# Patient Record
Sex: Female | Born: 1935 | Race: White | Hispanic: No | Smoking: Never smoker
Health system: Southern US, Community
[De-identification: ages and names within clinical notes are randomized; demographics above are authoritative.]

## PROBLEM LIST (undated history)

## (undated) DIAGNOSIS — N183 Chronic kidney disease, stage 3 unspecified: Secondary | ICD-10-CM

## (undated) DIAGNOSIS — K5792 Diverticulitis of intestine, part unspecified, without perforation or abscess without bleeding: Secondary | ICD-10-CM

## (undated) DIAGNOSIS — L0390 Cellulitis, unspecified: Secondary | ICD-10-CM

## (undated) DIAGNOSIS — C801 Malignant (primary) neoplasm, unspecified: Secondary | ICD-10-CM

## (undated) DIAGNOSIS — N200 Calculus of kidney: Secondary | ICD-10-CM

## (undated) DIAGNOSIS — S92919A Unspecified fracture of unspecified toe(s), initial encounter for closed fracture: Secondary | ICD-10-CM

## (undated) DIAGNOSIS — E042 Nontoxic multinodular goiter: Secondary | ICD-10-CM

## (undated) DIAGNOSIS — I10 Essential (primary) hypertension: Secondary | ICD-10-CM

## (undated) DIAGNOSIS — E785 Hyperlipidemia, unspecified: Secondary | ICD-10-CM

## (undated) DIAGNOSIS — M4802 Spinal stenosis, cervical region: Secondary | ICD-10-CM

## (undated) DIAGNOSIS — M1990 Unspecified osteoarthritis, unspecified site: Secondary | ICD-10-CM

## (undated) DIAGNOSIS — M810 Age-related osteoporosis without current pathological fracture: Secondary | ICD-10-CM

## (undated) DIAGNOSIS — G250 Essential tremor: Secondary | ICD-10-CM

## (undated) HISTORY — PX: HIP SURGERY: SHX245

## (undated) HISTORY — PX: CHOLECYSTECTOMY: SHX55

## (undated) HISTORY — PX: TONSILLECTOMY: SUR1361

## (undated) HISTORY — PX: APPENDECTOMY: SHX54

## (undated) HISTORY — PX: BREAST LUMPECTOMY: SHX2

## (undated) HISTORY — PX: CATARACT EXTRACTION, BILATERAL: SHX1313

---

## 1980-03-29 HISTORY — PX: COLECTOMY: SHX59

## 2004-01-07 ENCOUNTER — Ambulatory Visit: Payer: Self-pay | Admitting: Internal Medicine

## 2004-02-13 ENCOUNTER — Ambulatory Visit: Payer: Self-pay | Admitting: Oncology

## 2004-03-26 ENCOUNTER — Ambulatory Visit: Payer: Self-pay | Admitting: Oncology

## 2004-04-06 ENCOUNTER — Ambulatory Visit: Payer: Self-pay | Admitting: Urology

## 2004-08-05 ENCOUNTER — Ambulatory Visit: Payer: Self-pay | Admitting: Radiation Oncology

## 2004-09-03 ENCOUNTER — Ambulatory Visit: Payer: Self-pay | Admitting: Oncology

## 2004-12-30 ENCOUNTER — Ambulatory Visit: Payer: Self-pay | Admitting: Unknown Physician Specialty

## 2005-02-19 ENCOUNTER — Ambulatory Visit: Payer: Self-pay | Admitting: Internal Medicine

## 2005-03-03 ENCOUNTER — Ambulatory Visit: Payer: Self-pay | Admitting: Oncology

## 2005-03-29 ENCOUNTER — Ambulatory Visit: Payer: Self-pay | Admitting: Oncology

## 2005-04-29 ENCOUNTER — Ambulatory Visit: Payer: Self-pay | Admitting: Oncology

## 2005-08-05 ENCOUNTER — Ambulatory Visit: Payer: Self-pay | Admitting: Oncology

## 2005-08-30 ENCOUNTER — Ambulatory Visit: Payer: Self-pay | Admitting: Oncology

## 2005-09-26 ENCOUNTER — Ambulatory Visit: Payer: Self-pay | Admitting: Oncology

## 2006-02-07 ENCOUNTER — Ambulatory Visit: Payer: Self-pay | Admitting: Oncology

## 2006-02-28 ENCOUNTER — Ambulatory Visit: Payer: Self-pay | Admitting: Oncology

## 2006-03-29 ENCOUNTER — Ambulatory Visit: Payer: Self-pay | Admitting: Oncology

## 2006-04-29 ENCOUNTER — Ambulatory Visit: Payer: Self-pay | Admitting: Oncology

## 2006-08-22 ENCOUNTER — Emergency Department: Payer: Self-pay | Admitting: Emergency Medicine

## 2006-08-22 ENCOUNTER — Other Ambulatory Visit: Payer: Self-pay

## 2006-08-25 ENCOUNTER — Ambulatory Visit: Payer: Self-pay | Admitting: Emergency Medicine

## 2006-08-28 ENCOUNTER — Ambulatory Visit: Payer: Self-pay | Admitting: Oncology

## 2006-09-01 ENCOUNTER — Ambulatory Visit: Payer: Self-pay | Admitting: Oncology

## 2006-09-23 ENCOUNTER — Ambulatory Visit: Payer: Self-pay | Admitting: Unknown Physician Specialty

## 2006-09-27 ENCOUNTER — Ambulatory Visit: Payer: Self-pay | Admitting: Oncology

## 2007-02-27 ENCOUNTER — Ambulatory Visit: Payer: Self-pay | Admitting: Oncology

## 2007-03-07 ENCOUNTER — Ambulatory Visit: Payer: Self-pay | Admitting: Oncology

## 2007-03-10 ENCOUNTER — Ambulatory Visit: Payer: Self-pay | Admitting: Urology

## 2007-03-30 ENCOUNTER — Ambulatory Visit: Payer: Self-pay | Admitting: Oncology

## 2007-04-30 ENCOUNTER — Ambulatory Visit: Payer: Self-pay | Admitting: Oncology

## 2007-08-28 ENCOUNTER — Ambulatory Visit: Payer: Self-pay | Admitting: Oncology

## 2007-09-14 ENCOUNTER — Ambulatory Visit: Payer: Self-pay | Admitting: Oncology

## 2007-09-15 IMAGING — US US PELV - US TRANSVAGINAL
1 series · 17 of 25 positions shown · non-contrast
Comparison: none

REASON FOR EXAM: pelvic pain
COMMENTS:

[Series 1: us pelv - us transvaginal · 17 of 34 slices shown]
[im 1/34]
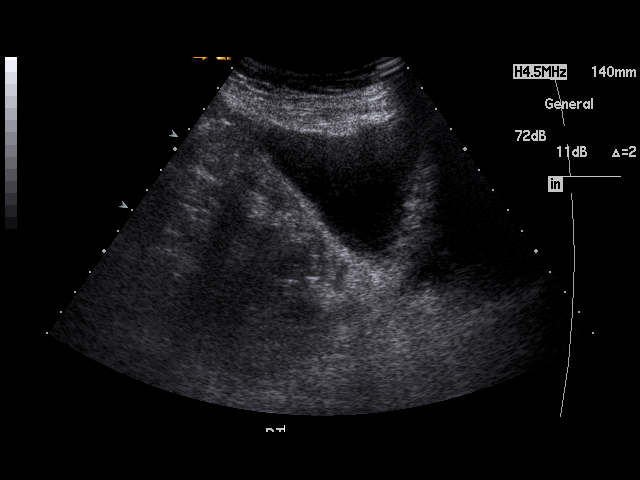
[im 3/34]
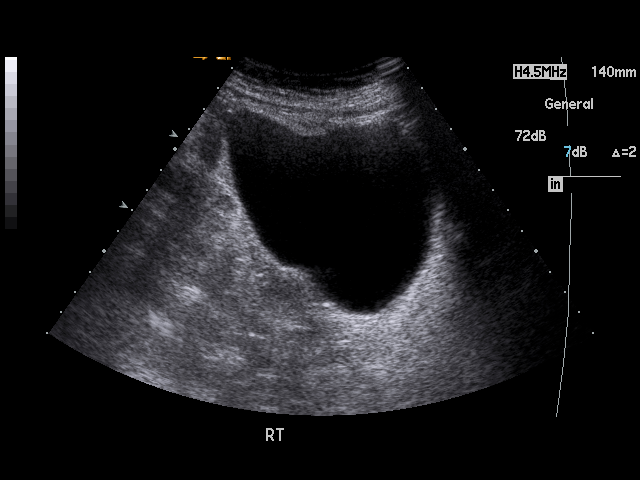
[im 5/34]
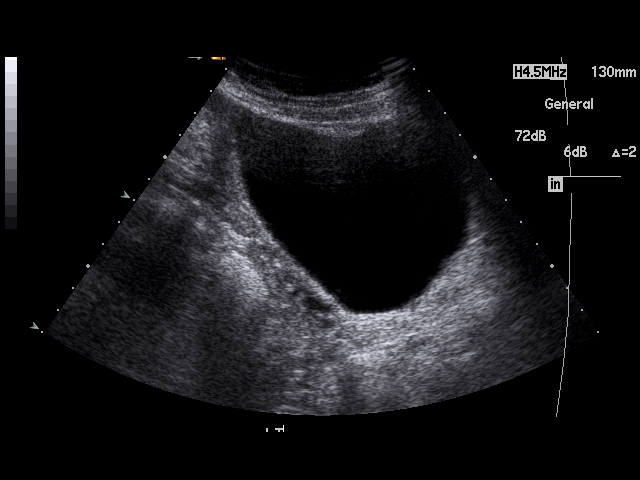
[im 7/34]
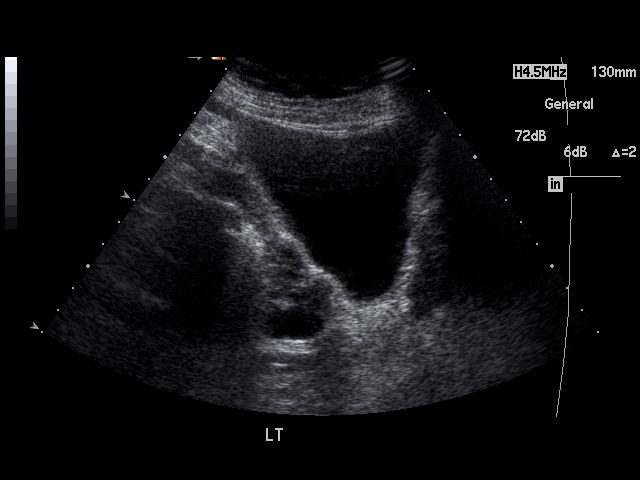
[im 9/34]
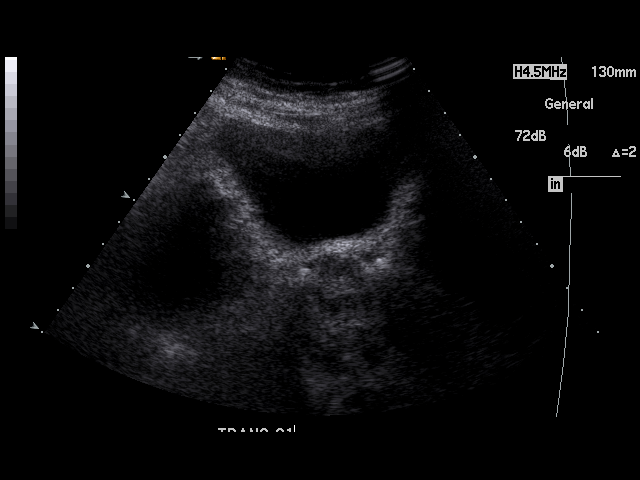
[im 12/34]
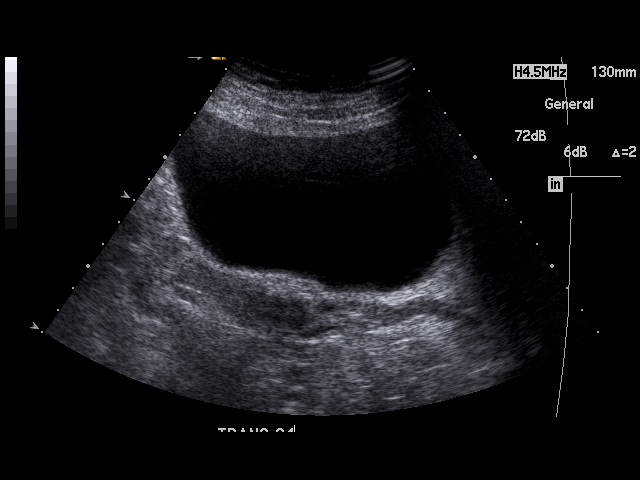
[im 13/34]
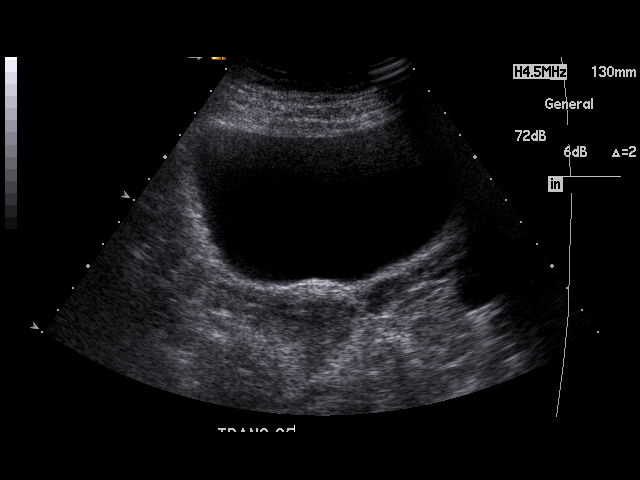
[im 16/34]
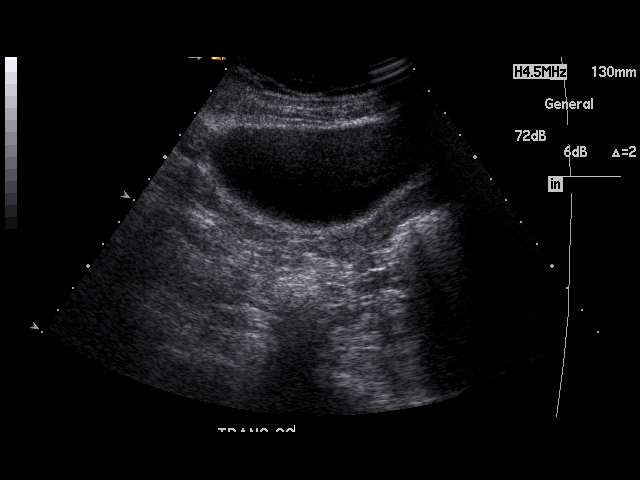
[im 17/34]
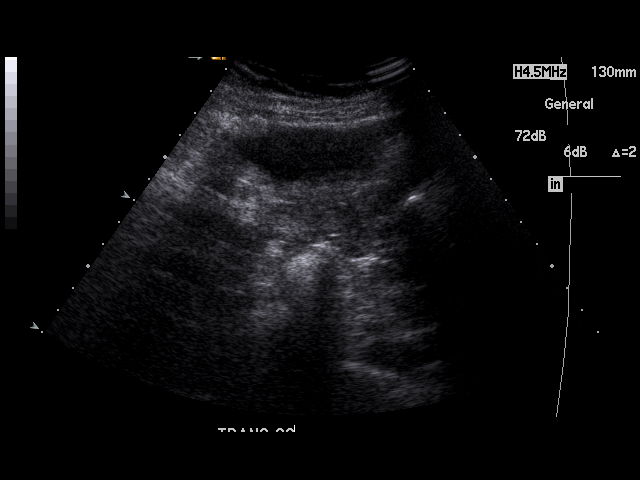
[im 18/34]
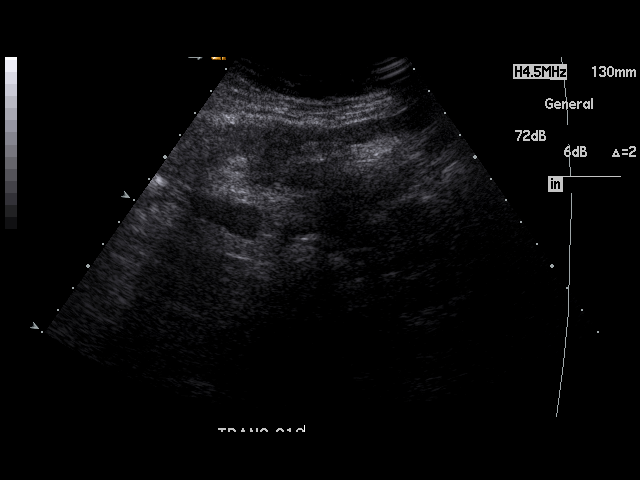
[im 21/34]
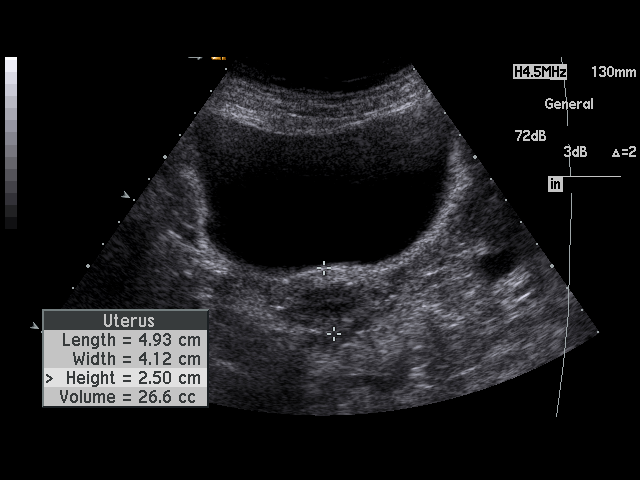
[im 23/34]
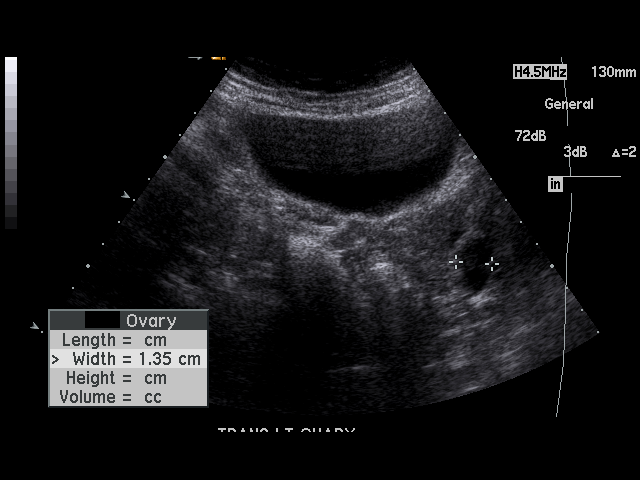
[im 25/34]
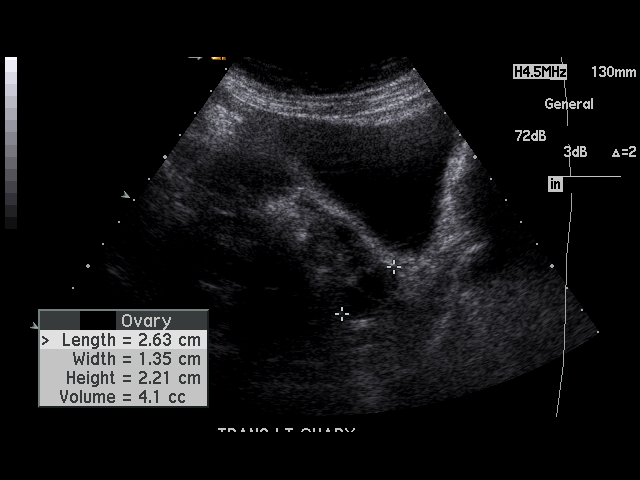
[im 27/34]
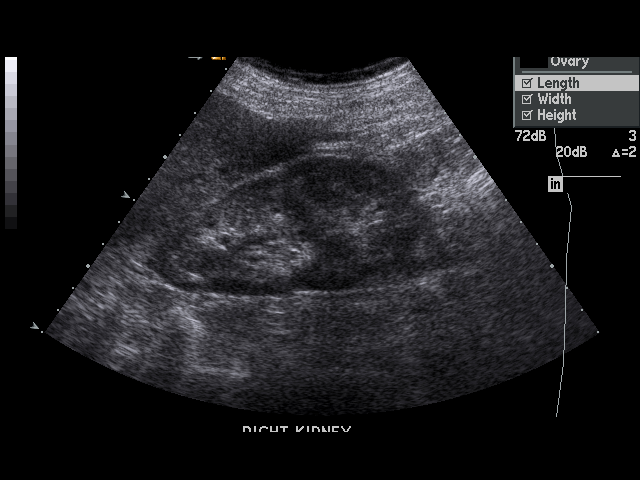
[im 29/34]
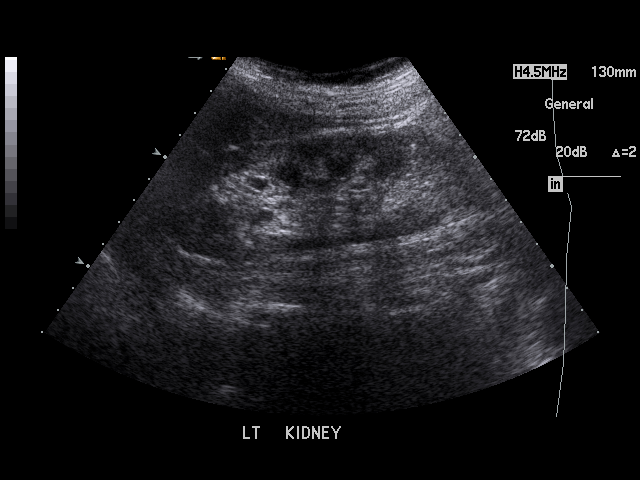
[im 31/34]
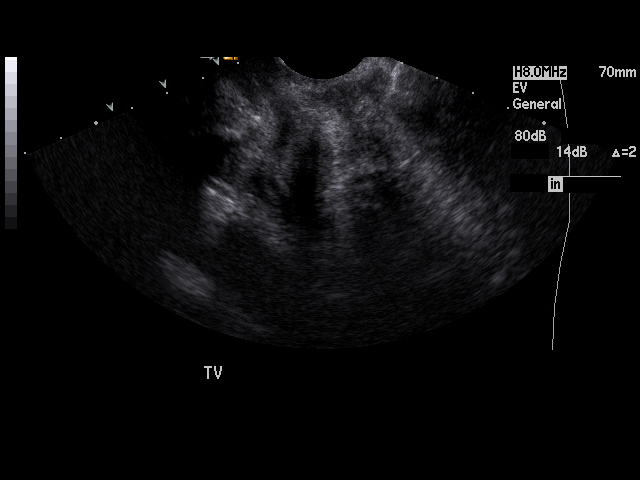
[im 34/34]
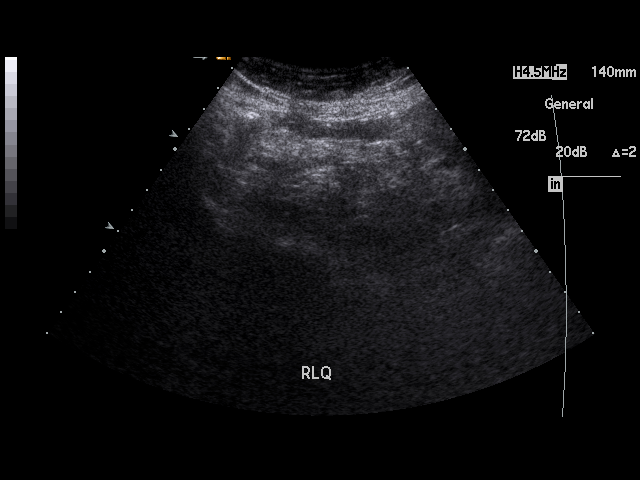

[17 of 25 positions shown; findings below may reference images not displayed]

PROCEDURE:     US  - US PELVIS MASS EXAM  - [DATE] [DATE] [DATE]  [DATE]

RESULT:     The uterus measures 4.93 cm x 4.12 cm x 2.50 cm.  No uterine
mass lesions are seen.  The endometrium measures 7.4 mm in thickness which
is greater than expected for the patient's age.  The RIGHT ovary is not
seen.  There is a 2.2 cm cystic structure in the LEFT adnexal area
compatible with a LEFT ovarian cyst.  No free fluid is noted in the pelvis.
The visualized portion of the urinary bladder is normal in appearance.  The
kidneys show no hydronephrosis.
IMPRESSION: The uterus is normal in size.

No focal uterine mass lesions are seen but the endometrium measures 7.4 mm
in thickness which is abnormally thick for the patient's age.

LEFT ovarian cyst.

The RIGHT ovary is not seen.

## 2007-09-27 ENCOUNTER — Ambulatory Visit: Payer: Self-pay | Admitting: Oncology

## 2007-10-18 ENCOUNTER — Ambulatory Visit: Payer: Self-pay | Admitting: Family Medicine

## 2007-10-20 ENCOUNTER — Other Ambulatory Visit: Payer: Self-pay

## 2007-10-20 ENCOUNTER — Ambulatory Visit: Payer: Self-pay | Admitting: Cardiology

## 2007-10-20 ENCOUNTER — Ambulatory Visit: Payer: Self-pay | Admitting: Surgery

## 2007-10-25 ENCOUNTER — Ambulatory Visit: Payer: Self-pay | Admitting: Surgery

## 2008-02-27 ENCOUNTER — Ambulatory Visit: Payer: Self-pay | Admitting: Oncology

## 2008-03-14 ENCOUNTER — Ambulatory Visit: Payer: Self-pay | Admitting: Oncology

## 2008-03-29 ENCOUNTER — Ambulatory Visit: Payer: Self-pay | Admitting: Oncology

## 2008-04-17 ENCOUNTER — Ambulatory Visit: Payer: Self-pay | Admitting: Oncology

## 2008-08-27 ENCOUNTER — Ambulatory Visit: Payer: Self-pay | Admitting: Oncology

## 2008-09-09 ENCOUNTER — Ambulatory Visit: Payer: Self-pay | Admitting: Urology

## 2008-09-10 ENCOUNTER — Ambulatory Visit: Payer: Self-pay | Admitting: Urology

## 2008-09-11 ENCOUNTER — Ambulatory Visit: Payer: Self-pay | Admitting: Oncology

## 2008-09-26 ENCOUNTER — Ambulatory Visit: Payer: Self-pay | Admitting: Oncology

## 2008-11-20 ENCOUNTER — Ambulatory Visit: Payer: Self-pay | Admitting: Unknown Physician Specialty

## 2008-11-25 ENCOUNTER — Ambulatory Visit: Payer: Self-pay | Admitting: Unknown Physician Specialty

## 2009-05-30 ENCOUNTER — Ambulatory Visit: Payer: Self-pay | Admitting: Internal Medicine

## 2009-10-27 ENCOUNTER — Ambulatory Visit: Payer: Self-pay | Admitting: Oncology

## 2009-10-31 ENCOUNTER — Ambulatory Visit: Payer: Self-pay | Admitting: Oncology

## 2009-11-27 ENCOUNTER — Ambulatory Visit: Payer: Self-pay | Admitting: Oncology

## 2010-06-01 ENCOUNTER — Ambulatory Visit: Payer: Self-pay | Admitting: Oncology

## 2010-11-03 ENCOUNTER — Ambulatory Visit: Payer: Self-pay | Admitting: Oncology

## 2010-11-28 ENCOUNTER — Ambulatory Visit: Payer: Self-pay | Admitting: Oncology

## 2011-06-02 ENCOUNTER — Ambulatory Visit: Payer: Self-pay | Admitting: Oncology

## 2011-10-19 ENCOUNTER — Ambulatory Visit: Payer: Self-pay | Admitting: Ophthalmology

## 2011-11-01 ENCOUNTER — Inpatient Hospital Stay: Payer: Self-pay | Admitting: Internal Medicine

## 2011-11-01 LAB — COMPREHENSIVE METABOLIC PANEL
Albumin: 3.5 g/dL (ref 3.4–5.0)
Alkaline Phosphatase: 70 U/L (ref 50–136)
Bilirubin,Total: 0.6 mg/dL (ref 0.2–1.0)
Calcium, Total: 9.3 mg/dL (ref 8.5–10.1)
Co2: 28 mmol/L (ref 21–32)
Creatinine: 1.71 mg/dL — ABNORMAL HIGH (ref 0.60–1.30)
EGFR (African American): 30 — ABNORMAL LOW
EGFR (Non-African Amer.): 26 — ABNORMAL LOW
Glucose: 88 mg/dL (ref 65–99)
Osmolality: 297 (ref 275–301)
SGOT(AST): 21 U/L (ref 15–37)
SGPT (ALT): 18 U/L (ref 12–78)

## 2011-11-01 LAB — CBC
HCT: 33.2 % — ABNORMAL LOW (ref 35.0–47.0)
HGB: 11.2 g/dL — ABNORMAL LOW (ref 12.0–16.0)
MCH: 32.4 pg (ref 26.0–34.0)
MCHC: 33.6 g/dL (ref 32.0–36.0)
RBC: 3.45 10*6/uL — ABNORMAL LOW (ref 3.80–5.20)
WBC: 12.4 10*3/uL — ABNORMAL HIGH (ref 3.6–11.0)

## 2011-11-01 LAB — PROTIME-INR
INR: 0.9
Prothrombin Time: 12.5 secs (ref 11.5–14.7)

## 2011-11-01 LAB — HEMOGLOBIN: HGB: 10.7 g/dL — ABNORMAL LOW (ref 12.0–16.0)

## 2011-11-02 LAB — CBC WITH DIFFERENTIAL/PLATELET
Basophil %: 0.3 %
Eosinophil #: 0.5 10*3/uL (ref 0.0–0.7)
Eosinophil %: 6.1 %
HCT: 24.8 % — ABNORMAL LOW (ref 35.0–47.0)
HGB: 8.6 g/dL — ABNORMAL LOW (ref 12.0–16.0)
Lymphocyte %: 18.6 %
MCHC: 34.6 g/dL (ref 32.0–36.0)
Monocyte #: 0.8 x10 3/mm (ref 0.2–0.9)
Monocyte %: 9 %
Neutrophil %: 66 %
Platelet: 147 10*3/uL — ABNORMAL LOW (ref 150–440)
RBC: 2.59 10*6/uL — ABNORMAL LOW (ref 3.80–5.20)
WBC: 8.4 10*3/uL (ref 3.6–11.0)

## 2011-11-02 LAB — URINALYSIS, COMPLETE
Bacteria: NONE SEEN
Bilirubin,UR: NEGATIVE
Glucose,UR: NEGATIVE mg/dL (ref 0–75)
Ketone: NEGATIVE
Leukocyte Esterase: NEGATIVE
Ph: 5 (ref 4.5–8.0)
Protein: NEGATIVE
RBC,UR: <1 /HPF (ref 0–5)
Squamous Epithelial: <1
WBC UR: 1 /HPF (ref 0–5)

## 2011-11-02 LAB — BASIC METABOLIC PANEL
Anion Gap: 8 (ref 7–16)
BUN: 48 mg/dL — ABNORMAL HIGH (ref 7–18)
Co2: 26 mmol/L (ref 21–32)
Creatinine: 1.64 mg/dL — ABNORMAL HIGH (ref 0.60–1.30)
EGFR (African American): 32 — ABNORMAL LOW
EGFR (Non-African Amer.): 28 — ABNORMAL LOW
Glucose: 88 mg/dL (ref 65–99)
Sodium: 144 mmol/L (ref 136–145)

## 2011-11-03 LAB — COMPREHENSIVE METABOLIC PANEL
Alkaline Phosphatase: 56 U/L (ref 50–136)
Anion Gap: 9 (ref 7–16)
BUN: 35 mg/dL — ABNORMAL HIGH (ref 7–18)
Bilirubin,Total: 0.5 mg/dL (ref 0.2–1.0)
Chloride: 113 mmol/L — ABNORMAL HIGH (ref 98–107)
Creatinine: 1.41 mg/dL — ABNORMAL HIGH (ref 0.60–1.30)
EGFR (African American): 38 — ABNORMAL LOW
Glucose: 110 mg/dL — ABNORMAL HIGH (ref 65–99)
Potassium: 3.9 mmol/L (ref 3.5–5.1)
SGOT(AST): 10 U/L — ABNORMAL LOW (ref 15–37)
SGPT (ALT): 11 U/L — ABNORMAL LOW (ref 12–78)
Total Protein: 4.9 g/dL — ABNORMAL LOW (ref 6.4–8.2)

## 2011-11-03 LAB — CBC WITH DIFFERENTIAL/PLATELET
Basophil %: 0.2 %
Eosinophil #: 0 10*3/uL (ref 0.0–0.7)
Eosinophil %: 0 %
HCT: 27 % — ABNORMAL LOW (ref 35.0–47.0)
HGB: 9.3 g/dL — ABNORMAL LOW (ref 12.0–16.0)
Lymphocyte %: 11.5 %
MCHC: 34.5 g/dL (ref 32.0–36.0)
MCV: 94 fL (ref 80–100)
Monocyte %: 5.8 %
Neutrophil #: 10 10*3/uL — ABNORMAL HIGH (ref 1.4–6.5)
Neutrophil %: 82.5 %
RBC: 2.87 10*6/uL — ABNORMAL LOW (ref 3.80–5.20)
WBC: 12.1 10*3/uL — ABNORMAL HIGH (ref 3.6–11.0)

## 2011-11-04 LAB — CBC WITH DIFFERENTIAL/PLATELET
Basophil #: 0 10*3/uL (ref 0.0–0.1)
Basophil %: 0.5 %
Eosinophil #: 0.2 10*3/uL (ref 0.0–0.7)
HGB: 9.2 g/dL — ABNORMAL LOW (ref 12.0–16.0)
Lymphocyte #: 1.8 10*3/uL (ref 1.0–3.6)
Lymphocyte %: 21 %
MCH: 32.7 pg (ref 26.0–34.0)
MCHC: 34.6 g/dL (ref 32.0–36.0)
MCV: 95 fL (ref 80–100)
Monocyte #: 0.7 x10 3/mm (ref 0.2–0.9)
Neutrophil #: 5.8 10*3/uL (ref 1.4–6.5)
Neutrophil %: 67.4 %
RDW: 15 % — ABNORMAL HIGH (ref 11.5–14.5)

## 2011-11-04 LAB — BASIC METABOLIC PANEL
BUN: 27 mg/dL — ABNORMAL HIGH (ref 7–18)
Calcium, Total: 8.1 mg/dL — ABNORMAL LOW (ref 8.5–10.1)
Chloride: 115 mmol/L — ABNORMAL HIGH (ref 98–107)
Co2: 27 mmol/L (ref 21–32)
Creatinine: 1.62 mg/dL — ABNORMAL HIGH (ref 0.60–1.30)
EGFR (African American): 33 — ABNORMAL LOW
EGFR (Non-African Amer.): 28 — ABNORMAL LOW
Glucose: 98 mg/dL (ref 65–99)
Potassium: 4 mmol/L (ref 3.5–5.1)
Sodium: 149 mmol/L — ABNORMAL HIGH (ref 136–145)

## 2011-11-05 LAB — CBC WITH DIFFERENTIAL/PLATELET
Basophil #: 0 10*3/uL (ref 0.0–0.1)
Basophil %: 0.3 %
Eosinophil #: 0.3 10*3/uL (ref 0.0–0.7)
Eosinophil %: 3.8 %
HGB: 9.2 g/dL — ABNORMAL LOW (ref 12.0–16.0)
Lymphocyte %: 20.4 %
MCH: 32.8 pg (ref 26.0–34.0)
MCHC: 34.4 g/dL (ref 32.0–36.0)
Monocyte #: 0.8 x10 3/mm (ref 0.2–0.9)
Neutrophil %: 67 %
Platelet: 140 10*3/uL — ABNORMAL LOW (ref 150–440)
RBC: 2.8 10*6/uL — ABNORMAL LOW (ref 3.80–5.20)

## 2011-11-08 ENCOUNTER — Ambulatory Visit: Payer: Self-pay | Admitting: Oncology

## 2011-11-08 LAB — CBC CANCER CENTER
Basophil #: 0 x10 3/mm (ref 0.0–0.1)
Basophil %: 0.4 %
Eosinophil %: 4.3 %
HGB: 10.3 g/dL — ABNORMAL LOW (ref 12.0–16.0)
Lymphocyte #: 1.5 x10 3/mm (ref 1.0–3.6)
Lymphocyte %: 17 %
MCH: 32.4 pg (ref 26.0–34.0)
MCV: 97 fL (ref 80–100)
Monocyte #: 0.8 x10 3/mm (ref 0.2–0.9)
Neutrophil #: 6 x10 3/mm (ref 1.4–6.5)
RBC: 3.17 10*6/uL — ABNORMAL LOW (ref 3.80–5.20)
RDW: 14.9 % — ABNORMAL HIGH (ref 11.5–14.5)
WBC: 8.7 x10 3/mm (ref 3.6–11.0)

## 2011-11-08 LAB — COMPREHENSIVE METABOLIC PANEL
Anion Gap: 6 — ABNORMAL LOW (ref 7–16)
Calcium, Total: 8.9 mg/dL (ref 8.5–10.1)
Chloride: 103 mmol/L (ref 98–107)
Co2: 33 mmol/L — ABNORMAL HIGH (ref 21–32)
Creatinine: 1.52 mg/dL — ABNORMAL HIGH (ref 0.60–1.30)
EGFR (African American): 35 — ABNORMAL LOW
EGFR (Non-African Amer.): 30 — ABNORMAL LOW
Osmolality: 287 (ref 275–301)
Potassium: 4.4 mmol/L (ref 3.5–5.1)
Sodium: 142 mmol/L (ref 136–145)
Total Protein: 6.1 g/dL — ABNORMAL LOW (ref 6.4–8.2)

## 2011-11-28 ENCOUNTER — Ambulatory Visit: Payer: Self-pay | Admitting: Oncology

## 2012-06-15 ENCOUNTER — Encounter: Payer: Self-pay | Admitting: Rheumatology

## 2012-06-27 ENCOUNTER — Encounter: Payer: Self-pay | Admitting: Rheumatology

## 2012-06-27 ENCOUNTER — Ambulatory Visit: Payer: Self-pay | Admitting: Oncology

## 2012-08-08 ENCOUNTER — Ambulatory Visit: Payer: Self-pay | Admitting: Rheumatology

## 2012-11-03 ENCOUNTER — Ambulatory Visit: Payer: Self-pay | Admitting: Oncology

## 2012-11-06 LAB — CBC CANCER CENTER
Basophil #: 0 x10 3/mm (ref 0.0–0.1)
Basophil %: 0.5 %
Eosinophil #: 0.3 x10 3/mm (ref 0.0–0.7)
HGB: 12.6 g/dL (ref 12.0–16.0)
Lymphocyte #: 2 x10 3/mm (ref 1.0–3.6)
Lymphocyte %: 24.8 %
MCH: 33.8 pg (ref 26.0–34.0)
MCV: 97 fL (ref 80–100)
Monocyte #: 0.8 x10 3/mm (ref 0.2–0.9)
Neutrophil #: 4.9 x10 3/mm (ref 1.4–6.5)
Neutrophil %: 61.2 %
Platelet: 203 x10 3/mm (ref 150–440)
RBC: 3.74 10*6/uL — ABNORMAL LOW (ref 3.80–5.20)
RDW: 13.9 % (ref 11.5–14.5)
WBC: 8 x10 3/mm (ref 3.6–11.0)

## 2012-11-06 LAB — COMPREHENSIVE METABOLIC PANEL
Albumin: 3.6 g/dL (ref 3.4–5.0)
Alkaline Phosphatase: 72 U/L (ref 50–136)
Calcium, Total: 9.6 mg/dL (ref 8.5–10.1)
Chloride: 104 mmol/L (ref 98–107)
Co2: 31 mmol/L (ref 21–32)
Creatinine: 1.61 mg/dL — ABNORMAL HIGH (ref 0.60–1.30)
EGFR (African American): 33 — ABNORMAL LOW
EGFR (Non-African Amer.): 28 — ABNORMAL LOW
Glucose: 89 mg/dL (ref 65–99)
Osmolality: 281 (ref 275–301)
Potassium: 4.9 mmol/L (ref 3.5–5.1)
SGOT(AST): 20 U/L (ref 15–37)
SGPT (ALT): 21 U/L (ref 12–78)
Sodium: 140 mmol/L (ref 136–145)
Total Protein: 6.4 g/dL (ref 6.4–8.2)

## 2012-11-27 ENCOUNTER — Ambulatory Visit: Payer: Self-pay | Admitting: Oncology

## 2013-02-16 ENCOUNTER — Ambulatory Visit: Payer: Self-pay | Admitting: Oncology

## 2013-02-17 ENCOUNTER — Emergency Department: Payer: Self-pay | Admitting: Emergency Medicine

## 2013-02-18 ENCOUNTER — Ambulatory Visit: Payer: Self-pay | Admitting: Oncology

## 2013-06-04 ENCOUNTER — Emergency Department: Payer: Self-pay | Admitting: Emergency Medicine

## 2013-06-09 ENCOUNTER — Emergency Department: Payer: Self-pay | Admitting: Internal Medicine

## 2013-06-29 ENCOUNTER — Ambulatory Visit: Payer: Self-pay | Admitting: Internal Medicine

## 2013-08-23 ENCOUNTER — Inpatient Hospital Stay: Payer: Self-pay | Admitting: Internal Medicine

## 2013-08-23 DIAGNOSIS — M4802 Spinal stenosis, cervical region: Secondary | ICD-10-CM | POA: Insufficient documentation

## 2013-08-23 DIAGNOSIS — K5731 Diverticulosis of large intestine without perforation or abscess with bleeding: Secondary | ICD-10-CM | POA: Insufficient documentation

## 2013-08-23 DIAGNOSIS — G250 Essential tremor: Secondary | ICD-10-CM | POA: Insufficient documentation

## 2013-08-23 DIAGNOSIS — N183 Chronic kidney disease, stage 3 unspecified: Secondary | ICD-10-CM | POA: Insufficient documentation

## 2013-08-23 DIAGNOSIS — N200 Calculus of kidney: Secondary | ICD-10-CM | POA: Insufficient documentation

## 2013-08-23 DIAGNOSIS — C189 Malignant neoplasm of colon, unspecified: Secondary | ICD-10-CM | POA: Insufficient documentation

## 2013-08-23 DIAGNOSIS — M810 Age-related osteoporosis without current pathological fracture: Secondary | ICD-10-CM | POA: Insufficient documentation

## 2013-08-23 DIAGNOSIS — E042 Nontoxic multinodular goiter: Secondary | ICD-10-CM | POA: Insufficient documentation

## 2013-08-23 DIAGNOSIS — E785 Hyperlipidemia, unspecified: Secondary | ICD-10-CM | POA: Insufficient documentation

## 2013-08-23 LAB — CBC
HCT: 34.1 % — ABNORMAL LOW (ref 35.0–47.0)
HGB: 11.3 g/dL — ABNORMAL LOW (ref 12.0–16.0)
MCH: 32.4 pg (ref 26.0–34.0)
MCHC: 33 g/dL (ref 32.0–36.0)
MCV: 98 fL (ref 80–100)
PLATELETS: 178 10*3/uL (ref 150–440)
RBC: 3.47 10*6/uL — ABNORMAL LOW (ref 3.80–5.20)
RDW: 12.8 % (ref 11.5–14.5)
WBC: 14.7 10*3/uL — ABNORMAL HIGH (ref 3.6–11.0)

## 2013-08-23 LAB — CK TOTAL AND CKMB (NOT AT ARMC)
CK, Total: 244 U/L — ABNORMAL HIGH
CK-MB: 6.9 ng/mL — AB (ref 0.5–3.6)

## 2013-08-23 LAB — BASIC METABOLIC PANEL
Anion Gap: 6 — ABNORMAL LOW (ref 7–16)
BUN: 22 mg/dL — AB (ref 7–18)
CHLORIDE: 106 mmol/L (ref 98–107)
Calcium, Total: 8.9 mg/dL (ref 8.5–10.1)
Co2: 29 mmol/L (ref 21–32)
Creatinine: 1.38 mg/dL — ABNORMAL HIGH (ref 0.60–1.30)
EGFR (African American): 39 — ABNORMAL LOW
EGFR (Non-African Amer.): 34 — ABNORMAL LOW
Glucose: 122 mg/dL — ABNORMAL HIGH (ref 65–99)
Osmolality: 286 (ref 275–301)
POTASSIUM: 4.5 mmol/L (ref 3.5–5.1)
SODIUM: 141 mmol/L (ref 136–145)

## 2013-08-23 LAB — PROTIME-INR
INR: 1.1
Prothrombin Time: 13.6 secs (ref 11.5–14.7)

## 2013-08-23 LAB — TROPONIN I: Troponin-I: 0.63 ng/mL — ABNORMAL HIGH

## 2013-08-23 LAB — APTT: Activated PTT: 28.4 secs (ref 23.6–35.9)

## 2013-08-24 LAB — TROPONIN I: Troponin-I: 1.9 ng/mL — ABNORMAL HIGH

## 2013-08-24 LAB — CBC WITH DIFFERENTIAL/PLATELET
BASOS PCT: 0.3 %
Basophil #: 0 10*3/uL (ref 0.0–0.1)
EOS PCT: 0.2 %
Eosinophil #: 0 10*3/uL (ref 0.0–0.7)
HCT: 27.9 % — ABNORMAL LOW (ref 35.0–47.0)
HGB: 9.2 g/dL — ABNORMAL LOW (ref 12.0–16.0)
LYMPHS PCT: 14.8 %
Lymphocyte #: 1.3 10*3/uL (ref 1.0–3.6)
MCH: 32.4 pg (ref 26.0–34.0)
MCHC: 33.1 g/dL (ref 32.0–36.0)
MCV: 98 fL (ref 80–100)
MONO ABS: 1 x10 3/mm — AB (ref 0.2–0.9)
MONOS PCT: 12 %
Neutrophil #: 6.3 10*3/uL (ref 1.4–6.5)
Neutrophil %: 72.7 %
Platelet: 157 10*3/uL (ref 150–440)
RBC: 2.85 10*6/uL — AB (ref 3.80–5.20)
RDW: 12.8 % (ref 11.5–14.5)
WBC: 8.7 10*3/uL (ref 3.6–11.0)

## 2013-08-24 LAB — BASIC METABOLIC PANEL
Anion Gap: 7 (ref 7–16)
BUN: 23 mg/dL — ABNORMAL HIGH (ref 7–18)
CALCIUM: 8.1 mg/dL — AB (ref 8.5–10.1)
CHLORIDE: 107 mmol/L (ref 98–107)
Co2: 26 mmol/L (ref 21–32)
Creatinine: 1.36 mg/dL — ABNORMAL HIGH (ref 0.60–1.30)
EGFR (Non-African Amer.): 34 — ABNORMAL LOW
GFR CALC AF AMER: 40 — AB
Glucose: 130 mg/dL — ABNORMAL HIGH (ref 65–99)
OSMOLALITY: 285 (ref 275–301)
Potassium: 4.8 mmol/L (ref 3.5–5.1)
Sodium: 140 mmol/L (ref 136–145)

## 2013-08-24 LAB — PROTIME-INR
INR: 1.2
Prothrombin Time: 14.6 secs (ref 11.5–14.7)

## 2013-08-24 LAB — CK-MB
CK-MB: 9.5 ng/mL — AB (ref 0.5–3.6)
CK-MB: 8.8 ng/mL — AB (ref 0.5–3.6)

## 2013-08-25 LAB — CBC WITH DIFFERENTIAL/PLATELET
BASOS ABS: 0 10*3/uL (ref 0.0–0.1)
BASOS PCT: 0.2 %
EOS PCT: 0.9 %
Eosinophil #: 0.1 10*3/uL (ref 0.0–0.7)
HCT: 27.2 % — AB (ref 35.0–47.0)
HGB: 9 g/dL — ABNORMAL LOW (ref 12.0–16.0)
LYMPHS ABS: 1.6 10*3/uL (ref 1.0–3.6)
LYMPHS PCT: 15.3 %
MCH: 33 pg (ref 26.0–34.0)
MCHC: 33.2 g/dL (ref 32.0–36.0)
MCV: 99 fL (ref 80–100)
MONO ABS: 1.4 x10 3/mm — AB (ref 0.2–0.9)
Monocyte %: 13.5 %
Neutrophil #: 7.2 10*3/uL — ABNORMAL HIGH (ref 1.4–6.5)
Neutrophil %: 70.1 %
Platelet: 145 10*3/uL — ABNORMAL LOW (ref 150–440)
RBC: 2.73 10*6/uL — AB (ref 3.80–5.20)
RDW: 13 % (ref 11.5–14.5)
WBC: 10.2 10*3/uL (ref 3.6–11.0)

## 2013-08-25 LAB — TROPONIN I: TROPONIN-I: 0.56 ng/mL — AB

## 2013-08-26 LAB — BASIC METABOLIC PANEL
Anion Gap: 4 — ABNORMAL LOW (ref 7–16)
BUN: 19 mg/dL — ABNORMAL HIGH (ref 7–18)
CHLORIDE: 107 mmol/L (ref 98–107)
CREATININE: 1.55 mg/dL — AB (ref 0.60–1.30)
Calcium, Total: 7.7 mg/dL — ABNORMAL LOW (ref 8.5–10.1)
Co2: 26 mmol/L (ref 21–32)
EGFR (Non-African Amer.): 29 — ABNORMAL LOW
GFR CALC AF AMER: 34 — AB
Glucose: 107 mg/dL — ABNORMAL HIGH (ref 65–99)
Osmolality: 277 (ref 275–301)
Potassium: 4.2 mmol/L (ref 3.5–5.1)
SODIUM: 137 mmol/L (ref 136–145)

## 2013-08-26 LAB — CBC WITH DIFFERENTIAL/PLATELET
Basophil #: 0 10*3/uL (ref 0.0–0.1)
Basophil %: 0.2 %
Eosinophil #: 0.1 10*3/uL (ref 0.0–0.7)
Eosinophil %: 1.1 %
HCT: 23.5 % — AB (ref 35.0–47.0)
HGB: 7.7 g/dL — ABNORMAL LOW (ref 12.0–16.0)
LYMPHS ABS: 1.3 10*3/uL (ref 1.0–3.6)
LYMPHS PCT: 10.9 %
MCH: 32.1 pg (ref 26.0–34.0)
MCHC: 32.8 g/dL (ref 32.0–36.0)
MCV: 98 fL (ref 80–100)
MONO ABS: 1.4 x10 3/mm — AB (ref 0.2–0.9)
MONOS PCT: 11.9 %
NEUTROS PCT: 75.9 %
Neutrophil #: 8.9 10*3/uL — ABNORMAL HIGH (ref 1.4–6.5)
Platelet: 151 10*3/uL (ref 150–440)
RBC: 2.4 10*6/uL — ABNORMAL LOW (ref 3.80–5.20)
RDW: 13 % (ref 11.5–14.5)
WBC: 11.7 10*3/uL — AB (ref 3.6–11.0)

## 2013-08-27 LAB — BASIC METABOLIC PANEL
Anion Gap: 3 — ABNORMAL LOW (ref 7–16)
BUN: 20 mg/dL — ABNORMAL HIGH (ref 7–18)
Calcium, Total: 7.6 mg/dL — ABNORMAL LOW (ref 8.5–10.1)
Chloride: 111 mmol/L — ABNORMAL HIGH (ref 98–107)
Co2: 25 mmol/L (ref 21–32)
Creatinine: 1.46 mg/dL — ABNORMAL HIGH (ref 0.60–1.30)
EGFR (African American): 36 — ABNORMAL LOW
EGFR (Non-African Amer.): 31 — ABNORMAL LOW
Glucose: 103 mg/dL — ABNORMAL HIGH (ref 65–99)
Osmolality: 280 (ref 275–301)
Potassium: 4.1 mmol/L (ref 3.5–5.1)
Sodium: 139 mmol/L (ref 136–145)

## 2013-08-27 LAB — HEMOGLOBIN: HGB: 7.8 g/dL — ABNORMAL LOW (ref 12.0–16.0)

## 2013-08-28 LAB — CBC WITH DIFFERENTIAL/PLATELET
BASOS ABS: 0 10*3/uL (ref 0.0–0.1)
BASOS PCT: 0.3 %
Eosinophil #: 0.2 10*3/uL (ref 0.0–0.7)
Eosinophil %: 1.9 %
HCT: 22.8 % — ABNORMAL LOW (ref 35.0–47.0)
HGB: 7.7 g/dL — ABNORMAL LOW (ref 12.0–16.0)
LYMPHS ABS: 0.9 10*3/uL — AB (ref 1.0–3.6)
Lymphocyte %: 10.8 %
MCH: 32 pg (ref 26.0–34.0)
MCHC: 33.7 g/dL (ref 32.0–36.0)
MCV: 95 fL (ref 80–100)
MONO ABS: 0.9 x10 3/mm (ref 0.2–0.9)
Monocyte %: 10.2 %
NEUTROS PCT: 76.8 %
Neutrophil #: 6.5 10*3/uL (ref 1.4–6.5)
Platelet: 160 10*3/uL (ref 150–440)
RBC: 2.4 10*6/uL — AB (ref 3.80–5.20)
RDW: 14.1 % (ref 11.5–14.5)
WBC: 8.4 10*3/uL (ref 3.6–11.0)

## 2013-08-29 ENCOUNTER — Encounter: Payer: Self-pay | Admitting: Internal Medicine

## 2013-09-02 LAB — CBC WITH DIFFERENTIAL/PLATELET
COMMENT - H1-COM1: NORMAL
COMMENT - H1-COM2: NORMAL
EOS PCT: 3 %
HCT: 29.2 % — ABNORMAL LOW (ref 35.0–47.0)
HGB: 9.4 g/dL — ABNORMAL LOW (ref 12.0–16.0)
Lymphocytes: 24 %
MCH: 32.3 pg (ref 26.0–34.0)
MCHC: 32.1 g/dL (ref 32.0–36.0)
MCV: 101 fL — ABNORMAL HIGH (ref 80–100)
MONOS PCT: 7 %
Metamyelocyte: 1 %
Platelet: 308 10*3/uL (ref 150–440)
RBC: 2.91 10*6/uL — ABNORMAL LOW (ref 3.80–5.20)
RDW: 15.2 % — ABNORMAL HIGH (ref 11.5–14.5)
Segmented Neutrophils: 65 %
WBC: 9.9 10*3/uL (ref 3.6–11.0)

## 2013-09-04 LAB — CBC WITH DIFFERENTIAL/PLATELET
BASOS ABS: 0 10*3/uL (ref 0.0–0.1)
Basophil %: 0.4 %
EOS ABS: 0.5 10*3/uL (ref 0.0–0.7)
Eosinophil %: 4.9 %
HCT: 28.7 % — ABNORMAL LOW (ref 35.0–47.0)
HGB: 9.3 g/dL — ABNORMAL LOW (ref 12.0–16.0)
Lymphocyte #: 1.4 10*3/uL (ref 1.0–3.6)
Lymphocyte %: 15.3 %
MCH: 32.4 pg (ref 26.0–34.0)
MCHC: 32.4 g/dL (ref 32.0–36.0)
MCV: 100 fL (ref 80–100)
Monocyte #: 0.8 x10 3/mm (ref 0.2–0.9)
Monocyte %: 8.4 %
Neutrophil #: 6.7 10*3/uL — ABNORMAL HIGH (ref 1.4–6.5)
Neutrophil %: 71 %
Platelet: 338 10*3/uL (ref 150–440)
RBC: 2.87 10*6/uL — AB (ref 3.80–5.20)
RDW: 15.2 % — AB (ref 11.5–14.5)
WBC: 9.4 10*3/uL (ref 3.6–11.0)

## 2013-09-04 LAB — BASIC METABOLIC PANEL
ANION GAP: 3 — AB (ref 7–16)
BUN: 19 mg/dL — ABNORMAL HIGH (ref 7–18)
CO2: 30 mmol/L (ref 21–32)
CREATININE: 1.21 mg/dL (ref 0.60–1.30)
Calcium, Total: 8.7 mg/dL (ref 8.5–10.1)
Chloride: 106 mmol/L (ref 98–107)
EGFR (African American): 46 — ABNORMAL LOW
EGFR (Non-African Amer.): 39 — ABNORMAL LOW
Glucose: 100 mg/dL — ABNORMAL HIGH (ref 65–99)
OSMOLALITY: 280 (ref 275–301)
POTASSIUM: 4.4 mmol/L (ref 3.5–5.1)
Sodium: 139 mmol/L (ref 136–145)

## 2013-09-26 ENCOUNTER — Encounter: Payer: Self-pay | Admitting: Internal Medicine

## 2013-10-20 ENCOUNTER — Ambulatory Visit: Payer: Self-pay | Admitting: Gerontology

## 2013-10-27 ENCOUNTER — Encounter: Payer: Self-pay | Admitting: Internal Medicine

## 2013-11-13 DIAGNOSIS — I10 Essential (primary) hypertension: Secondary | ICD-10-CM | POA: Insufficient documentation

## 2013-12-31 ENCOUNTER — Ambulatory Visit (INDEPENDENT_AMBULATORY_CARE_PROVIDER_SITE_OTHER): Payer: Medicare Other | Admitting: Podiatry

## 2013-12-31 ENCOUNTER — Encounter: Payer: Self-pay | Admitting: Podiatry

## 2013-12-31 VITALS — BP 132/60 | HR 56 | Resp 16 | Ht 62.0 in | Wt 125.0 lb

## 2013-12-31 DIAGNOSIS — M79676 Pain in unspecified toe(s): Secondary | ICD-10-CM

## 2013-12-31 DIAGNOSIS — B351 Tinea unguium: Secondary | ICD-10-CM

## 2013-12-31 NOTE — Progress Notes (Signed)
She presents today with a chief complaint of painful elongated toenails.  Objective: Pulses are palpable bilateral. Nails are thick yellow dystrophic with mycotic and painful palpation.  Assessment: Pain in limb secondary to onychomycosis 1 through 5 bilateral.  Plan: Debridement of nails 1 through 5 bilateral covered service secondary to pain.

## 2014-02-06 ENCOUNTER — Ambulatory Visit (INDEPENDENT_AMBULATORY_CARE_PROVIDER_SITE_OTHER): Payer: Medicare Other | Admitting: Podiatry

## 2014-02-06 DIAGNOSIS — M79676 Pain in unspecified toe(s): Secondary | ICD-10-CM

## 2014-02-06 DIAGNOSIS — B351 Tinea unguium: Secondary | ICD-10-CM

## 2014-02-06 NOTE — Progress Notes (Signed)
She presents today with chief complaint of painful elongated toenails and sharply incurvated nails to the tibial border of the hallux and second digit of the left foot.  Objective: Vital signs are stable she is alert and oriented 3 nails are thick yellow dystrophic onychomycotic and sharply incurvated along those aforementioned nail plates.  Assessment: Pain in limb secondary to onychomycosis and ingrown nails.  Plan: Debridement of nails 1 through 5 bilateral covered service secondary to pain.

## 2014-04-01 ENCOUNTER — Ambulatory Visit: Payer: Medicare Other | Admitting: Podiatry

## 2014-04-15 ENCOUNTER — Ambulatory Visit (INDEPENDENT_AMBULATORY_CARE_PROVIDER_SITE_OTHER): Payer: Medicare Other | Admitting: Podiatry

## 2014-04-15 DIAGNOSIS — M79676 Pain in unspecified toe(s): Secondary | ICD-10-CM

## 2014-04-15 DIAGNOSIS — B351 Tinea unguium: Secondary | ICD-10-CM

## 2014-04-15 NOTE — Progress Notes (Signed)
She presents today with chief complaint of painful elongated nails and sharp radial nail margin long second digit of the bilateral foot.  Objective: Her nails are thick yellow dystrophic onychomycotic and painful to palpation. Pulses are palpable bilateral.  Assessment: A new limb secondary to onychomycosis 1 through 5 bilateral.  Plan: Debrided nails 1 through 5 bilateral covered service secondary to pain.

## 2014-06-14 DIAGNOSIS — G252 Other specified forms of tremor: Secondary | ICD-10-CM | POA: Insufficient documentation

## 2014-06-17 ENCOUNTER — Ambulatory Visit: Payer: Medicare Other

## 2014-06-19 ENCOUNTER — Ambulatory Visit: Payer: Medicare Other

## 2014-07-03 ENCOUNTER — Ambulatory Visit
Admit: 2014-07-03 | Disposition: A | Discharge status: Home / Self Care | Payer: Self-pay | Attending: Internal Medicine | Admitting: Internal Medicine

## 2014-07-10 ENCOUNTER — Inpatient Hospital Stay
Admit: 2014-07-10 | Disposition: A | Discharge status: Home / Self Care | Payer: Self-pay | Attending: Internal Medicine | Admitting: Internal Medicine

## 2014-07-10 LAB — COMPREHENSIVE METABOLIC PANEL
ALBUMIN: 4.3 g/dL
ALK PHOS: 61 U/L
ANION GAP: 8 (ref 7–16)
AST: 22 U/L
BUN: 26 mg/dL — ABNORMAL HIGH
Bilirubin,Total: 0.7 mg/dL
CHLORIDE: 100 mmol/L — AB
CO2: 29 mmol/L
Calcium, Total: 9.8 mg/dL
Creatinine: 1.87 mg/dL — ABNORMAL HIGH
EGFR (African American): 27 — ABNORMAL LOW
EGFR (Non-African Amer.): 23 — ABNORMAL LOW
Glucose: 109 mg/dL — ABNORMAL HIGH
POTASSIUM: 4.8 mmol/L
SGPT (ALT): 17 U/L
Sodium: 137 mmol/L
Total Protein: 7.2 g/dL

## 2014-07-10 LAB — DIFFERENTIAL
BASOS ABS: 0.1 10*3/uL (ref 0.0–0.1)
Basophil %: 0.5 %
Eosinophil #: 0.1 10*3/uL (ref 0.0–0.7)
Eosinophil %: 0.7 %
LYMPHS PCT: 13.8 %
Lymphocyte #: 1.4 10*3/uL (ref 1.0–3.6)
MONOS PCT: 14.6 %
Monocyte #: 1.5 x10 3/mm — ABNORMAL HIGH (ref 0.2–0.9)
Neutrophil #: 7.2 10*3/uL — ABNORMAL HIGH (ref 1.4–6.5)
Neutrophil %: 70.4 %

## 2014-07-10 LAB — CBC
HCT: 40.1 % (ref 35.0–47.0)
HGB: 13.8 g/dL (ref 12.0–16.0)
MCH: 33.5 pg (ref 26.0–34.0)
MCHC: 34.4 g/dL (ref 32.0–36.0)
MCV: 97 fL (ref 80–100)
Platelet: 219 10*3/uL (ref 150–440)
RBC: 4.13 10*6/uL (ref 3.80–5.20)
RDW: 13.1 % (ref 11.5–14.5)
WBC: 10.3 10*3/uL (ref 3.6–11.0)

## 2014-07-10 LAB — URINALYSIS, COMPLETE
BILIRUBIN, UR: NEGATIVE
Bacteria: NONE SEEN
GLUCOSE, UR: NEGATIVE mg/dL (ref 0–75)
KETONE: NEGATIVE
Leukocyte Esterase: NEGATIVE
Nitrite: NEGATIVE
PH: 7 (ref 4.5–8.0)
Protein: NEGATIVE
Specific Gravity: 1.012 (ref 1.003–1.030)

## 2014-07-10 LAB — LACTIC ACID, PLASMA: Lactic Acid, Venous: 1 mmol/L

## 2014-07-10 LAB — PROTIME-INR
INR: 0.9
Prothrombin Time: 12.7 secs

## 2014-07-10 LAB — PHOSPHORUS: Phosphorus: 3.1 mg/dL

## 2014-07-10 LAB — TROPONIN I

## 2014-07-10 LAB — MAGNESIUM: Magnesium: 2.1 mg/dL

## 2014-07-11 LAB — CBC WITH DIFFERENTIAL/PLATELET
BASOS ABS: 0 10*3/uL (ref 0.0–0.1)
Basophil %: 0.1 %
EOS ABS: 0 10*3/uL (ref 0.0–0.7)
Eosinophil %: 0 %
HCT: 34.8 % — ABNORMAL LOW (ref 35.0–47.0)
HGB: 11.6 g/dL — ABNORMAL LOW (ref 12.0–16.0)
LYMPHS ABS: 0.9 10*3/uL — AB (ref 1.0–3.6)
Lymphocyte %: 10.5 %
MCH: 32.8 pg (ref 26.0–34.0)
MCHC: 33.4 g/dL (ref 32.0–36.0)
MCV: 98 fL (ref 80–100)
Monocyte #: 0.2 x10 3/mm (ref 0.2–0.9)
Monocyte %: 2.3 %
NEUTROS PCT: 87.1 %
Neutrophil #: 7.8 10*3/uL — ABNORMAL HIGH (ref 1.4–6.5)
PLATELETS: 182 10*3/uL (ref 150–440)
RBC: 3.54 10*6/uL — ABNORMAL LOW (ref 3.80–5.20)
RDW: 13.2 % (ref 11.5–14.5)
WBC: 9 10*3/uL (ref 3.6–11.0)

## 2014-07-11 LAB — BASIC METABOLIC PANEL
Anion Gap: 7 (ref 7–16)
BUN: 22 mg/dL — AB
CALCIUM: 8.1 mg/dL — AB
Chloride: 108 mmol/L
Co2: 25 mmol/L
Creatinine: 1.5 mg/dL — ABNORMAL HIGH
EGFR (African American): 35 — ABNORMAL LOW
EGFR (Non-African Amer.): 30 — ABNORMAL LOW
GLUCOSE: 158 mg/dL — AB
Potassium: 4.2 mmol/L
Sodium: 140 mmol/L

## 2014-07-11 LAB — LIPID PANEL
CHOLESTEROL: 146 mg/dL
HDL: 52 mg/dL
Ldl Cholesterol, Calc: 87 mg/dL
TRIGLYCERIDES: 35 mg/dL
VLDL CHOLESTEROL, CALC: 7 mg/dL

## 2014-07-12 LAB — URINE CULTURE

## 2014-07-15 LAB — CULTURE, BLOOD (SINGLE)

## 2014-07-16 NOTE — Consult Note (Signed)
Chief complaint is lower GI bleed.  She had a small marroon stool this morning and none since.  She also had none overnight.  Eating full liquids and tol well.  No signif abd pain.  BP128/50, p 54, hgb 9.3 after one unit.  Abd soft chest clear, fingers pink.  No further bleeding.  If does well could go home tomorrow or Friday.  Would like for her to ambulate some before discharge.  Hold iron pills for a week or so since these will cause stools to be black and could be confusing.  Electronic Signatures: Manya Silvas (MD)  (Signed on 07-Aug-13 17:29)  Authored  Last Updated: 07-Aug-13 17:29 by Manya Silvas (MD)

## 2014-07-16 NOTE — Discharge Summary (Signed)
PATIENT NAMEYULIET, NEEDS MR#:  [id number] DATE OF BIRTH:  [birth year]  DATE OF ADMISSION:  11/01/2011 DATE OF DISCHARGE:  11/05/2011  DISCHARGE DIAGNOSES:  1. Diverticular gastrointestinal bleed with diverticulitis.  2. Drug rash.  3. Hypertension.  4. Osteoporosis.   DISCHARGE MEDICATIONS:  1. Amlodipine 5 mg daily.  2. Lisinopril 10 mg daily.  3. Vitamin D3 daily.  4. Claritin 10 mg daily.  5. Propranolol 20 mg daily. 6. Durezol ophthalmic emulsion one drop b.i.d.   REASON FOR ADMISSION: 81 year old female presents with rash and lower rectal bleeding. Please see history and physical for history of present illness, past medical history, and physical exam.   HOSPITAL COURSE: The patient was admitted, given bowel rest, IV fluids. The rash which was likely related to some eyedrops was treated with IV steroids and the rash resolved. Bleeding scan negative. She required one unit of PRBCs. Hemoglobin stabilized at 9.2. No evidence of further bleeding. Rocephin was empirically given. Abdominal pain resolved. She is up and around and with her lack of further bleeding, Dr. Vira Agar felt that a colonoscopy was not warranted and opted for conservative treatment. Overall she is stable to go home. Follow-up with Dr. Sabra Heck in one week. Prognosis good.   ____________________________ Rusty Aus, MD mfm:ap D: 11/05/2011 07:14:22 ET              T: 11/05/2011 12:26:40 ET               JOB#: [id number] cc: Rusty Aus, MD, <Dictator> Rusty Aus MD ELECTRONICALLY SIGNED 11/08/2011 8:18

## 2014-07-16 NOTE — Consult Note (Signed)
Chief complaint of rectal bleeding.  No rectal bleeding today or yesterday.  Pt abd not tender but she complains of a little soreness.  VSS afebrile, hgb 9.2, plt 134,WBC 8.7, afebrile.  Will have nurse ambulate in halls with probability of discharge tomorrow.  At 81 years of age I would prefer not to put her through a prep and colonoscopy.  If she rebleeds would further consider this option.  Electronic Signatures: Manya Silvas (MD)  (Signed on 08-Aug-13 15:28)  Authored  Last Updated: 08-Aug-13 15:28 by Manya Silvas (MD)

## 2014-07-16 NOTE — H&P (Signed)
PATIENT NAME:  [name], SCHLEY MR#:  [id number] DATE OF BIRTH:  02/15/1921  DATE OF ADMISSION:  11/01/2011  ER REFERRING PHYSICIAN: Loletta Specter, MD  PRIMARY CARE PHYSICIAN: Emily Filbert, MD  REASON FOR ADMISSION: Rectal bleeding.   HISTORY OF PRESENT ILLNESS: This is a pleasant 81 year old white female with past medical history of osteoarthritis, status post right colectomy for history of colon cancer, and lower gastrointestinal bleed who presents with six episodes of bright red blood per rectum mixed with red stool which began yesterday evening. She has had no associated abdominal pain, no nausea or vomiting. She went to her the [facility] office and was sent here for admission. She does have a history of GI cancer and bleeding from diverticulosis and malignancy. She denies any chest pain, shortness of breath, nausea, vomiting, diarrhea, fevers, chills, or shakes. She did take Aleve last week for back pain and she did receive steroids earlier. A couple of days ago she had a rash and was started on cetirizine and Depo-prednisone. She received IV Protonix in the ER. Her hemoglobin and vitals have been stable. We are asked to admit the patient for gastrointestinal bleeding. Her primary care physician is Dr. Emily Filbert.   PAST MEDICAL HISTORY:  1. Partial colectomy for right colon cancer in 07-23-1981. 2. Lower gastrointestinal bleed in 2000-07-23 due to diverticulosis and mucosa irritation at the anastamosis junction.  3. Hyperlipidemia.  4. Fibrocystic breast disease. 5. Osteoporosis.   PAST SURGICAL HISTORY:  1. Right colectomy.  2. Right breast carcinoma T1, N0, M0 status post radiation and chemotherapy with mastectomy.  3. Appendectomy.  4. Tonsillectomy and adenoidectomy. 5. Spinal cyst removal.    MEDICATIONS:  1. Aleve 220 mg 1 tablet every eight hours.  2. Amlodipine 5 mg daily.  3. Centrum Silver 1 tablet daily.  4. Claritin 10 mg daily.  5. Dorzolamide ophthalmic  emulsion one drop to operative eye four times daily.  6. Lisinopril 10 mg daily. 7. Meloxicam 7.5 mg once a day for arthritis. 8. Propranolol 20 mg daily. 9. Super B complex 1 tablet once a day. 10. Vitamin D3 1000 international units once a day.   DRUG ALLERGIES: Aricept which causes nausea.   SOCIAL HISTORY: She is widowed. Her husband passed away in Jul 24, 2022. She has one daughter who passed with leukemia. She has another daughter who is healthy. She does not smoke, drink, or use illicit drugs. She used to work in [employer], retired.   FAMILY HISTORY: Mother died at 46 secondary to pancreatic cancer and diabetes. Father died in his late 12s of myocardial infarction.  REVIEW OF SYSTEMS: CONSTITUTIONAL: No fever, fatigue, weakness, pain, or weight loss. EYES: No blurred vision, double vision, pain, redness, inflammation, or glaucoma. She did have cataract surgery a couple of weeks ago. She does wear glasses. ENT: No epistaxis or discharge or nasal congestion. RESPIRATORY: No cough, wheezing, hemoptysis, dyspnea, asthma, or painful respirations. CARDIOVASCULAR: No chest pain, orthopnea, edema, arrhythmias, dyspnea on exertion, or palpitations. GI: No nausea, vomiting, diarrhea, abdominal pain, hematemesis, melena or gastroesophageal reflux disease. She does have rectal bleeding. GU: No dysuria, hematuria, renal calculi, frequency, or incontinence. GYN/BREAST: No mass masses, tenderness, or vaginal discharge. She is postmenopausal. ENDOCRINE: No polyuria, nocturia, thyroid problems, increased sweating, heat or cold intolerance. HEME/LYMPH: No anemia, easy bruising, bleeding, or swollen glands. INTEGUMENT: No acne, rash, or change in mole, hair, or skin. MUSCULOSKELETAL: No pain in back, shoulders, knees, or hips. No swelling or gout. She did  have some back pain about a week ago. NEUROLOGIC: No numbness, weakness, dysarthria, epilepsy, tremor, vertigo, or ataxia. PSYCH: No anxiety, insomnia, ADD, bipolar, or  depression.    PHYSICAL EXAMINATION:   VITAL SIGNS: Temperature 96.6, heart rate 53, respiratory rate 22, blood pressure 127/56, and saturating 98% on room air.  GENERAL: Well-developed, well-nourished, in no apparent distress, alert and oriented x3.   HEENT: Pupils are equal and reactive to light and accommodation. Extraocular movements are intact. Anicteric sclerae. No difficulty hearing. Oropharynx clear.   NECK: No JVD, thyromegaly, lymphadenopathy, or carotid bruits.   LUNGS: Clear to auscultation. No adventitious breath sounds.   HEART: Regular rate and rhythm. Normal S1 and S2. No murmurs, gallops, or rubs appreciated. PMI not lateralized. 2+ dorsalis pedis pulses.   BREASTS: No obvious masses.   ABDOMEN: Soft and nontender, positive bowel sounds. No organomegaly or hernia.   GENITOURINARY: Deferred.   MUSCULOSKELETAL: Strength 5/5. No clubbing, cyanosis, or degenerative joint disease.   SKIN: No rashes, lesions, or induration.   LYMPH: No lymphadenopathy in the cervical area.   NEUROLOGIC: Cranial nerves II through XII are intact. No dysarthria, aphasia, dysphagia, or contractures.   PSYCH: Alert and oriented x3.   LABORATORY DATA: Glucose 88, BUN 38, creatinine 1.71, sodium 145, potassium 4.5, chloride 108, bicarbonate 28, anion gap 9, total protein 6.6, albumin 3.5, total bilirubin 0.6, alkaline phosphatase 70, AST 21, and ALT 18. White count 12.4, hemoglobin 11.2, hematocrit 32.2, and platelets 187.   ASSESSMENT AND PLAN: This is a pleasant 81 year old white female with past medical history of hyperlipidemia, gastrointestinal bleed, and colon cancer status post right colectomy who presents with bright red blood per rectum.  1. Bright red blood per rectum secondary to GI bleed, likely lower GI bleed. We will give IV Protonix twice a day. We will type and screen, give IV fluids. We will get serial hemoglobins and transfuse as needed. Currently, the patient is  asymptomatic. We will consult Dr. Vira Agar, probably will need a colonoscopy.  2. History of hypertension. The patient is normotensive. We will hold medication since she is normotensive and afraid that her blood pressure may  drop with her loss of blood.  3. Anemia. Due to acute blood loss anemia from GI bleeding. We will monitor. Type and screen.  4. Acute renal failure with elevated BUN, most likely GI bleeding and prerenal azotemia. We will avoid nephrotoxins and give IV fluids and check BMP in the morning.  5. Osteoarthritis. We will hold NSAIDs. 6. Rash. The patient received steroids which could have also factored in her GI bleeding, but we will give p.r.n. hydroxyzine.  7. Deep vein thrombosis prophylaxis. Maintain with TED stockings and SCDs.   CODE STATUS: FULL CODE.   We will sign out the patient later today to [facility].   TOTAL TIME SPENT: 50 minutes.  ____________________________ Judeth Horn Royden Purl, MD aaf:slb D: 11/01/2011 14:52:55 ET T: 11/01/2011 15:32:01 ET JOB#: [id number]  cc: Mike Craze A. Royden Purl, MD, <Dictator> Rusty Aus, MD Manya Silvas, MD Mike Craze Cassell Clement MD ELECTRONICALLY SIGNED 11/01/2011 16:03

## 2014-07-16 NOTE — Consult Note (Signed)
Pt with painless lower GI bleeding as chief complaint, gas discomfort but no pain suggestive of diverticulitis.  No vomiting, no fever, exam shows a non tender abdomen. She has hx of colonic neoplasm, previous diverticulitis, surgery for both.  Last colonoscopy was in 2010.  Will follow hgb over night as most diverticular bleeding will quit on its own.  Electronic Signatures: Manya Silvas (MD)  (Signed on 05-Aug-13 19:17)  Authored  Last Updated: 05-Aug-13 19:17 by Manya Silvas (MD)

## 2014-07-16 NOTE — Consult Note (Signed)
Pt continues to pass marroon bloody stool, hgb drifting down, WBC 8.7, hgb 8.6, plt 147 VSS.  Will get GI bleeding scan and spoke to Dr. Emily Filbert that I recommended her to be transfused with a unit of blood.  Chest clear.  Mucus membranes somewhat more pale than yesterday.  Likely a slow GI bleed from diverticulosis.  80-85% will quit on their own but some need colonoscopy.  If has not stopped by tomorrow will do colonoscopy tomorrow afternoon.  Electronic Signatures: Manya Silvas (MD)  (Signed on 06-Aug-13 09:51)  Authored  Last Updated: 06-Aug-13 09:51 by Manya Silvas (MD)

## 2014-07-20 NOTE — Discharge Summary (Signed)
PATIENT NAME:  [name], [name] MR#:  [id number] DATE OF BIRTH:  [birth year]  DATE OF ADMISSION:  08/23/2013 DATE OF DISCHARGE:  08/28/2013  DISCHARGE DIAGNOSES: 1.  Left hip intertrochanteric fracture.  2.  Acute blood loss anemia.  3.  Hypertension.   DISCHARGE MEDICATIONS: 1.  Lisinopril 10 mg daily. 2.  Super B complex 1 daily.  3.  Vitamin D3 1000 units daily.  4.  Propranolol 20 mg daily.  5.  Tylenol 500 mg b.i.d.  6.  Ginkgo biloba 120 mg daily.  7.  Gabapentin 100 mg at bedtime.  8.  Norco 5/325 q. 4 hours p.r.n. pain. 9.  Milk of magnesia 30 mL daily p.r.n.  10.  Lovenox 30 mg daily subcutaneous for 10 days. 11.  Ferrous sulfate 325 mg b.i.d.  12.  Dulcolax suppository daily p.r.n.  13.  Colace 240 mg at bedtime.  14.  Protonix 40 mg b.i.d. 15.  Calcium 500/200 b.i.d.   REASON FOR ADMISSION: A 81 year old female who presents with a left hip fracture. Please see H and P for HPI, past medical history, and physical exam.   HOSPITAL COURSE: The patient was admitted. She initially had some elevated cardiac enzymes thought due to stress but not MI. She underwent surgery without complication. Her hemoglobin dropped from 13 down to 7. She was transfused with 1 unit PRBC and her hemoglobin was stable at 7.7 for 3 days prior to discharge. She will be on b.i.d. iron. She continues to have a moderate amount of left hip. She will be getting OT and PT. Overall prognosis is guarded. Will have followup on her hemoglobin.   ____________________________ Rusty Aus, MD mfm:sb D: 08/28/2013 07:56:50 ET T: 08/28/2013 08:09:15 ET JOB#: [id number]  cc: Rusty Aus, MD, <Dictator> Sherre Wooton Roselee Culver MD ELECTRONICALLY SIGNED 08/29/2013 8:20

## 2014-07-20 NOTE — Consult Note (Signed)
PATIENT NAME:  [name], [name] MR#:  [id number] DATE OF BIRTH:  27-May-1920  DATE OF CONSULTATION:  08/24/2013  CONSULTING PHYSICIAN:  Isaias Cowman, MD  PRIMARY CARE PHYSICIAN: Emily Filbert.   CHIEF COMPLAINT:  "I fell."   REASON FOR CONSULTATION:  Consultation requested for evaluation for preoperative cardiovascular clearance with elevated troponin.   HISTORY OF PRESENT ILLNESS: The patient is 81 year old female with history of hypertension and hyperlipidemia. The patient was in his usual state of health, was sweeping the floor and fell. EMS was called. The patient was brought to [facility] Emergency Room where she was noted to have a left intertrochanteric fracture of the left hip. The patient denied chest pain or shortness of breath. Admission labs were notable for elevated troponin of 1.9 and an elevated CPK-MB of 9.5. EKG was nondiagnostic.   PAST MEDICAL HISTORY: 1.  Hypertension.  2.  Colon cancer.  3.  Osteoarthritis.  4.  History of breast cancer.   MEDICATIONS: Propranolol 20 mg daily, lisinopril 10 mg daily, vitamin E 400 units daily, vitamin D3, 1000 International Units daily; Tylenol 500 mg daily, Super B complex 1 daily, ginkgo biloba 120 mg daily, gabapentin 200 mg daily, 500 mg at bedtime; flaxseed 1 daily, Centrum multivitamin 1 daily, amlodipine 5 mg daily.   SOCIAL HISTORY: The patient currently lives alone. She has a remote tobacco abuse history.   FAMILY HISTORY: No immediate family history of coronary artery disease or myocardial infarction.   REVIEW OF SYSTEMS:  CONSTITUTIONAL: No fever or chills.  EYES: No blurry vision.  EARS: No hearing loss.  RESPIRATORY: No shortness of breath.  CARDIOVASCULAR: No chest pain.  GASTROINTESTINAL: No nausea, vomiting or diarrhea.  GENITOURINARY: No dysuria or hematuria.  ENDOCRINE: No polyuria or polydipsia.  MUSCULOSKELETAL: The patient has a hip fracture.  NEUROLOGIC: No focal muscle weakness or numbness.  PSYCHOLOGICAL:  No depression or anxiety.   PHYSICAL EXAMINATION: VITAL SIGNS: Blood pressure 92/49, pulse 57, respirations 16, temperature 99.7, pulse ox 90%.  HEENT: Pupils equal and reactive to light and accommodation.  NECK: Supple without thyromegaly.  LUNGS: Clear.  HEART: Normal JVP. Normal PMI. Regular rate and rhythm. Normal S1, S2. No appreciable gallop, murmur or rub.  ABDOMEN: Soft and nontender. Pulses were intact bilaterally.  MUSCULOSKELETAL: Normal muscle tone.  NEUROLOGIC: The patient is alert and oriented x 3. Motor and sensory are both grossly intact.   IMPRESSION: A 81 year old female who presents with left hip fracture, found to have incidental elevation of troponin and CPK-MB consistent with non-ST elevation myocardial infarction in the absence of chest pain or ECG changes. The patient does have increased risk for cardiovascular complication in light of elevated cardiac biomarkers. Discussed this in detail with family members, who wish to proceed with hip surgery as planned.   RECOMMENDATIONS: 1.  Agree with overall current therapy.  2.  Continue propranolol pre-, peri- and postoperatively.  3.  Obtain postoperative EKG.  4.  Defer further cardiac diagnostics at this time.    ____________________________ Isaias Cowman, MD ap:dmm D: 08/24/2013 16:52:59 ET T: 08/24/2013 20:29:27 ET JOB#: [id number]  cc: Isaias Cowman, MD, <Dictator> Isaias Cowman MD ELECTRONICALLY SIGNED 08/27/2013 15:05

## 2014-07-20 NOTE — H&P (Signed)
PATIENT NAME:  [name], [name] MR#:  [id number] DATE OF BIRTH:  1920/10/21  DATE OF ADMISSION:  08/23/2013  PRIMARY CARE PHYSICIAN:  Dr. Emily Filbert.   REFERRING PHYSICIAN:  Dr. Jacqualine Code.   CHIEF COMPLAINT:  Fall.   HISTORY OF PRESENT ILLNESS:  [name] is a 81 year old female who has history of hypertension, hyperlipidemia, colon cancer, breast cancer, followed at [facility], was sweeping the floor, felt lightheaded and fell down to the floor, could not stand up.  The patient crawled into the house, called her daughter, who alerted EMS and was brought to the Emergency Department.  Work-up in the Emergency Department, the patient is found to have left intertrochanteric fracture.  The patient denies having any fever, chills.  The patient has elevated white blood cell count of 14,000, however no obvious signs of infection are noted.  Emergency Department physician discussed with orthopedic surgeon who will see the patient in the morning.  The patient never had any heart attack, never had any stress test or heart cath done.   PAST MEDICAL HISTORY: 1.  Osteoarthritis.  2.  Colon cancer.  3.  Tremors.  4.  Breast cancer.  5.  Hypertension.  6.  Diverticulitis.  7.  Colectomy.  8.  Lumpectomy.  9.  Cholecystectomy.  10.  Appendectomy. 11.  Cataract surgery.   ALLERGIES:  No known drug allergies.   HOME MEDICATIONS: 1.  Vitamin E 400 units once a day.  2.  Vitamin D3 1000 international units once a day.  3.  Tylenol 500 mg once a day.  4.  Super B complex 1 tablet once a day.  5.  Propanol 20 mg once a day.  6.  Lisinopril 10 mg once a day.   7.  Gingko biloba 120 mg once a day.  8.  Gabapentin 200 mg once a day.  9.  Gabapentin 500 mg at bedtime.  10.  Flaxseed.  11.  Centrum multivitamin once a day.  12.  Amlodipine 5 mg once a day.   SOCIAL HISTORY:  No history of smoking, drinking alcohol or using illicit drugs.  Lives by herself.   FAMILY HISTORY:  Hypertension,  hyperlipidemia.   REVIEW OF SYSTEMS:  CONSTITUTIONAL:  Denies any generalized weakness.  EYES:  No change in vision.  EARS, NOSE, THROAT:  No change in hearing.  Experiencing dry mucous membranes.  RESPIRATORY:  No cough, shortness of breath.  CARDIOVASCULAR:  No chest pain, palpations.  GASTROINTESTINAL:  No nausea, vomiting, abdominal pain.  GENITOURINARY:  No dysuria or hematuria.  HEMATOLOGIC:  No easy bruising or bleeding.  SKIN:  No rash or lesions.  MUSCULOSKELETAL:  Experiencing left hip pain.    PHYSICAL EXAMINATION: GENERAL:  This is a well-built, well-nourished, age-appropriate female lying down in the bed, not in distress.  VITAL SIGNS:  Temperature 97.3, pulse 70, blood pressure 133/61, respiratory rate of 16, oxygen saturation is 94% on room air.  HEENT:  Head normocephalic, atraumatic.  Eyes, no scleral icterus.  Conjunctivae normal.  Pupils equal and reactive.  Extraocular movements are intact.  Mucous membranes moist.  No pharyngeal erythema.  NECK:  Supple.  No lymphadenopathy.  No JVD.  No carotid bruit.  No cardiomegaly.  HEART:  S1, S2, regular.  No murmurs are heard.  ABDOMEN:  Bowel sounds plus.  Soft, nontender, nondistended.  SKIN:  No rash or lesions.  MUSCULOSKELETAL:  Good range of motion in all the extremities.  NEUROLOGIC:  The patient is not oriented  to place, person, and time.  No apparent cranial nerve abnormalities.   LABORATORY DATA:  Left hip showed comminuted left intertrochanteric fracture with varus angulation of the femoral joint.   Chest x-ray, one view portable:  No acute cardiopulmonary disease.   ASSESSMENT AND PLAN:  [name] is a 81 year old female who comes to the Emergency Department with severe generalized weakness, most likely cause secondary to infection.  1.  Hypertension, currently well-controlled.  Continue with the home medications.  2.  Diabetes mellitus.  Continue with oral medications.  3.  Keep the patient on deep vein  thrombosis prophylaxis with Lovenox.   TIME SPENT:  50 minutes.    ____________________________ Monica Becton, MD pv:ea D: 08/23/2013 23:44:33 ET T: 08/24/2013 01:35:31 ET JOB#: [id number]  cc: Monica Becton, MD, <Dictator> Rusty Aus, MD Grier Mitts Perris Tripathi MD ELECTRONICALLY SIGNED 08/27/2013 23:21

## 2014-07-20 NOTE — Op Note (Signed)
PATIENT NAME:  [name], [name] MR#:  [id number] DATE OF BIRTH:  08/05/1920  DATE OF PROCEDURE:  08/25/2013  PREOPERATIVE DIAGNOSIS:  Comminuted displaced intertrochanteric fracture of the left hip.   POSTOPERATIVE DIAGNOSIS:  Comminuted displaced intertrochanteric fracture of the left hip.   PROCEDURE PERFORMED:  The left hip nailing with open reduction and internal fixation using trochanteric fixation nail (125 degrees/11 mm rod, a 95 mm helical blade, a 34 mm distal screw).   SURGEON:  Park Breed, M.D.   ANESTHESIA:  Spinal.   COMPLICATIONS:  None.   DRAINS:  None.   ESTIMATED BLOOD LOSS:  200 mL.   REPLACEMENT:  None.   DESCRIPTION OF PROCEDURE:  The patient was brought to the Operating Room where she underwent a satisfactory spinal anesthesia and was placed on the Operating Room table and padded and positioned appropriately. The right leg was flexed and abducted and the left leg was placed in traction and internally rotated. Fluoroscopy showed good positioning of the fracture with some the posterior angulation at the neck/shaft junction. The hip was prepped and draped in sterile fashion and a short longitudinal incision was made just above the greater trochanter. Dissection was carried out sharply through subcutaneous tissue and fascia with electrocautery being used for hemostasis. A large amount of old blood was removed. The guidepin was inserted through the trochanter under fluoroscopic control and a 17 mm reamer introduced to enlarge this hole. A 125 degrees x 11 mm rod was then passed down the shaft of the femur and positioned under fluoroscopy. A second stab wound was made distally and a drill guide inserted down and flush with the lateral cortex. With an upwardly directed force behind the hip, a guidepin was inserted and after slight modifications it was placed in excellent position in the femoral head on AP and lateral views with good restoration of the neck/shaft angle. The  lateral cortex was drilled and then the path for the helical blade was drilled. A 95 mm helical blade was inserted and the fracture compressed. The locking screw from above was tightened snugly. Fluoroscopy showed the helical blade and fracture to be in excellent alignment and position. A third stab wound was made distally and a hole drilled, and a 34 mm screw passed through the distal screw hole to prevent rotation. The outrigger was removed. The final fluoroscopy showed excellent position of the hardware and the fracture. The wounds were irrigated and the deep fascia closed with 0 Vicryl. Subcutaneous tissues were closed with 2-0 Vicryl and the skin was closed with staples. A dry sterile dressing was applied. The patient was taken out of traction with good motion of the hip. She was transferred to her hospital bed and taken to recovery in good condition.   ____________________________ Park Breed, MD hem:jm D: 08/25/2013 10:01:33 ET T: 08/25/2013 18:05:30 ET JOB#: [id number]  cc: Park Breed, MD, <Dictator> Park Breed MD ELECTRONICALLY SIGNED 08/26/2013 14:40

## 2014-07-20 NOTE — Consult Note (Signed)
Brief Consult Note: Diagnosis: left intertrochanteric femur fracture.   Patient was seen by consultant.   Comments: Mechanical fall when patient was bending over to water her flowers.  Denies any other injury other than left hip. Radiographs reviewed with patient and her family.  The risks and benefits of surgical intervention were discussed in detail with the patient. The patient expressed understanding of the risks and benefits and agreed with plans for surgery.   The family has requested Dr. Earnestine Leys perform the surgery. I wil notify his office of the family's request in the AM.  Electronic Signatures: Dereck Leep (MD)  (Signed 29-May-15 00:44)  Authored: Brief Consult Note   Last Updated: 29-May-15 00:44 by Dereck Leep (MD)

## 2014-07-20 NOTE — Consult Note (Signed)
Brief Consult Note: Diagnosis: Left hip comminuted intertrochanteric fracture.   Patient was seen by consultant.   Recommend to proceed with surgery or procedure.   Recommend further assessment or treatment.   Orders entered.   Discussed with Attending MD.   Comments: 81 year old female fell yesterday suffering a commminuted left intertrochanteric hip fracture.  Admitted last night for medical evaluation and treatment.  She has requested that my practice care for her and I will be glad to care of her.  The operating room will not be available until after midnight so we will plan on surgery first thing tomorrow AM. Plan Trochanteric Fixation Nail rodding.  Discussed with patient who wishes to proceed.  Risks and benefits of surgery were discussed at length including but not limited to infection, non union, nerve or blood vessed damage, non union, need for repeat surgery, blood clots and lung emboli, and death.   Eam: Alert and cooperative.  circulation/sensation/motor function good. pain with range of motion of hip.  leg short and rotated.   X-rays: as above  Plan:  Left hip nailing in AM..  Electronic Signatures: Park Breed (MD)  (Signed 29-May-15 15:06)  Authored: Brief Consult Note   Last Updated: 29-May-15 15:06 by Park Breed (MD)

## 2014-07-22 ENCOUNTER — Ambulatory Visit (INDEPENDENT_AMBULATORY_CARE_PROVIDER_SITE_OTHER): Payer: Medicare Other | Admitting: Podiatry

## 2014-07-22 DIAGNOSIS — B351 Tinea unguium: Secondary | ICD-10-CM

## 2014-07-22 DIAGNOSIS — M79676 Pain in unspecified toe(s): Secondary | ICD-10-CM

## 2014-07-22 NOTE — Progress Notes (Signed)
She presents today with chief complaint of painful elongated nails and sharp radial nail margin long second digit of the bilateral foot.  Objective: Her nails are thick yellow dystrophic onychomycotic and painful to palpation. Pulses are palpable bilateral.  Assessment: A new limb secondary to onychomycosis 1 through 5 bilateral.  Plan: Debrided nails 1 through 5 bilateral covered service secondary to pain.

## 2014-07-25 DIAGNOSIS — Z8673 Personal history of transient ischemic attack (TIA), and cerebral infarction without residual deficits: Secondary | ICD-10-CM | POA: Insufficient documentation

## 2014-07-28 NOTE — H&P (Signed)
PATIENT NAME:  [name], [name] MR#:  [id number] DATE OF BIRTH:  April 17, 1920  DATE OF ADMISSION:  07/10/2014  CHIEF COMPLAINT: Confusion and slurred speech.   HISTORY OF PRESENT ILLNESS: This is a 81 year old female whose history is given per her daughter, as the patient is somewhat confused and unable to provide much history reliably. Per the patient's daughter, she began having some what seemed like upper respiratory congestion as of the night prior to admission. She was up 1 time that night or the early in the morning of admission at 2:30 in the morning to use the bathroom and fell from her bedside commode, seemed to be okay afterwards though and, per her daughter, when she woke up in the morning she was still "normal" at her baseline, around lunchtime, and then around 3:00 in the afternoon her daughter saw her again and she was still her baseline. Later that evening, she was watching TV; she was watching the show Dr. Dian Situ, but the patient made a comment that she thought she was watching Jeopardy. She was found by her daughter at that time to be weak when walking with her walker, almost falling again, and began to have some slurred speech. Daughter stated that it sounded like she is talking with marbles in her mouth. She remained confused. At that point, she was brought to the ED. She was found to have a low-grade fever in the ED. The patient normally lives at home. Her daughter has lived with her since her hip repair surgery, which was done a number of months ago. In the ED, the patient was evaluated found to have some mild AKI without much else positive in the way of workup. Per the daughter, her speech had improved, but she still had speech that was not back to baseline and the patient was still confused, so hospitalists were called for workup for TIA.   PRIMARY CARE PHYSICIAN: Rusty Aus, MD, with [facility].   PAST MEDICAL HISTORY: Arthritis, colon cancer in 1982, breast cancer in 2001,  hypertension, diverticulitis, and tremor.   MEDICATIONS: Vitamin D3 at 1000 units daily; propranolol 20 mg daily; lisinopril 10 mg daily; gabapentin 100 mg 2 tablets in the morning 3 tablets in the afternoon; Norvasc 5 mg daily.   PAST SURGICAL HISTORY: Left hip repair, colectomy, lumpectomy.   ALLERGIES: VANCOMYCIN, WHICH CAUSES ITCHING AND HIVES.   FAMILY HISTORY: CAD, stroke, diabetes mellitus, cancer, kidney disease.   SOCIAL HISTORY: Nonsmoker, nondrinker. Denies illicit drug use.   REVIEW OF SYSTEMS: This review of systems from the patient is not entirely reliable given her current confusion, however, it is as follows:  CONSTITUTIONAL: Denies fever prior to coming to the ED, fatigue, or weakness.  EYES: Denies blurred or double vision, pain or redness.  EAR, NOSE, AND THROAT: Denies ear pain, hearing loss. Denies difficulty swallowing.  RESPIRATORY: Denies cough, dyspnea, or painful respiration.  CARDIOVASCULAR: Denies chest pain, edema, or palpitations.  GASTROINTESTINAL: Denies nausea, vomiting, diarrhea, abdominal pain, constipation.  GENITOURINARY: Denies dysuria, hematuria, or frequency.  ENDOCRINE: Denies nocturia, thyroid problems, heat or cold intolerance.  HEMATOLOGIC AND LYMPHATIC: Denies easy bruising or bleeding or swollen glands.  INTEGUMENTARY: Denies acne, rash, or lesion.  MUSCULOSKELETAL: Denies acute arthritis, joint swelling, or gout.  NEUROLOGICAL: Denies numbness. Denies focal weakness. Endorses some dysarthria, per her daughter. Denies headache. Denies seizure. Possible TIA.  PSYCHIATRIC: Denies anxiety, insomnia, or depression.   PHYSICAL EXAMINATION:  VITAL SIGNS: Blood pressure 136/54, pulse 79, temperature 100.6, respirations  16 with 94% oxygen saturations on room air.  HEENT: Pupils equal, round, and reactive to light and accommodation. Extraocular movements intact. No scleral icterus. Moist mucosal membranes.  NECK: Thyroid is not enlarged. Neck is  supple with no masses, nontender. No cervical adenopathy. No JVD. No carotid bruit. RESPIRATORY: Clear to auscultation bilaterally with no rales, rhonchi, or wheezing. No respiratory distress.  CARDIOVASCULAR: Regular rate and rhythm with no murmurs, rubs, or gallops on exam. Good pedal pulses with no lower extremity edema. ABDOMEN: Soft, nontender, nondistended with good bowel sounds.  MUSCULOSKELETAL: Muscular strength 5/5 in all 4 extremities. Full spontaneous range of motion throughout. No cyanosis or clubbing.  SKIN: No rash or lesions. Skin is warm, dry, and intact.  LYMPHATIC: No adenopathy.  NEUROLOGICAL: Cranial nerves are intact except for some mild seemingly slurring of her speech. Sensation is intact throughout. As stated, possible mild dysarthria. No aphasia. No contractures. No other focal symptoms or signs on physical exam.  PSYCHIATRIC: The patient is alert. She is oriented to person, but not oriented to time or place or circumstance. She is cooperative, confused, not agitated.   LABORATORY DATA: White count 10.3, hemoglobin 13.8, hematocrit 40.1, platelets 219,000. Sodium 137, potassium 4.8, chloride 100, bicarbonate 29, BUN 26, creatinine 1.87, glucose 109, calcium 9.8, phosphorus 3.1, magnesium 2.1, lactic acid 1.0. Total protein 7.2, albumin 4.3, total bilirubin 0.7, alkaline phosphatase 61, AST 22, ALT 17. Troponin less than 0.03. INR 0.9. Urinalysis negative.   RADIOLOGY: Chest x-ray: No acute cardiopulmonary findings. Head CT: No acute intracranial abnormalities. Chronic small vessel ischemia disease and brain atrophy. Left maxillary sinus disease, chronic.   ASSESSMENT AND PLAN:  1.  Transient ischemic attack. We will order an MRI for this, carotid Dopplers, echocardiogram. We will check her serum lipids. Order a neurology consult. Get physical therapy, occupational therapy, and speech therapy involved in a swallow study. We will allow permissive hypertension for 24 hours and  continue the workup per neurology's recommendations.  2.  Hypertension. The patient's blood pressure stable for now. We will hold her antihypertensives as above for now for permissive hypertension for 24 hours and then restart them as needed.  3.  Acute kidney injury. We will hydrate the patient with gentle fluids. Her creatinine is not that far above where it has been in the past; however, it is elevated from her prior value, which is her presumed recent baseline, which was actually in a normal range. We will continue to monitor her renal function.  4.  Tremor. We will hold her propranolol in order to allow for permissive hypertension, as above, and can restart this when she is outside of the 24-hour window.  5.  Deep vein thrombosis prophylaxis with subcutaneous Lovenox heparin.   CODE STATUS: This patient is full code.   TIME SPENT ON THIS ADMISSION: 50 minutes.   ____________________________ Wilford Corner. Jannifer Franklin, MD dfw:bm D: 07/11/2014 02:51:00 ET T: 07/11/2014 03:50:57 ET JOB#: [id number]  cc: Wilford Corner. Jannifer Franklin, MD, <Dictator> Matilynn Dacey Fawn Kirk MD ELECTRONICALLY SIGNED 07/11/2014 5:21

## 2014-07-28 NOTE — Consult Note (Signed)
Referring Physician:  Rusty Aus   Primary Care Physician:  Merdis Delay, [address], [place], [place] [address], [place] [phone]  Reason for Consult: Admit Date: 10-Jul-2014  Chief Complaint: slurred speech  Reason for Consult: CVA   History of Present Illness: History of Present Illness:   seen at request of Dr. Jannifer Franklin for possible stroke;  81 yo RHD F presents to [facility] secondary to slurred speech and some confusion.  Family was concerned of stroke.  Pt denies ever having any slurred speech but did feel sick yesterday like she was getting a viral illness.  Pt denies headache or any focal numbness/weakness.  Pt lives with daughter after breaking her hip but is independent on most ADLs except for cooking.  ROS:  General denies complaints   HEENT no complaints   Lungs cough   Cardiac no complaints   GI no complaints   GU no complaints   Musculoskeletal no complaints   Extremities no complaints   Skin no complaints   Neuro no complaints   Endocrine no complaints   Past Medical/Surgical Hx:  Arthritis:   colon cancer:   Tremors:   Cancer, Breast:   Hypertension:   Diverticulitis:   Hip Replacement - Left:   Colectomy:   Lumpectomy:   Past Medical/ Surgical Hx:  Past Medical History reviewed by me as above   Past Surgical History reviewed by me as above   Home Medications: Medication Instructions Last Modified Date/Time  Tylenol 500 mg oral tablet 1 tab(s) orally 2 times a day 13-Apr-16 22:38  amLODIPine 5 mg oral tablet 1 tab(s) orally once a day (in the morning) 13-Apr-16 22:38  Vitamin D3 1000 intl units oral tablet 1 tab(s) orally once a day (in the morning) 13-Apr-16 22:38  Calcium 500+D 1 tab(s) orally once a day (in the morning) 13-Apr-16 22:38  gabapentin 100 mg oral capsule 2 cap(s) orally once a day (in the morning) 13-Apr-16 22:38  gabapentin 100 mg oral capsule 3 cap(s) orally once a day (at bedtime) 13-Apr-16 22:38   Ginkgo Biloba - oral capsule 1 cap(s) orally once a day (in the morning) 13-Apr-16 22:38  lisinopril 10 mg oral tablet 1 tab(s) orally once a day (in the morning) 13-Apr-16 22:38  propranolol 20 mg oral tablet 1 tab(s) orally once a day (in the morning) 13-Apr-16 22:38  Stress B with C 1 tab(s) orally once a day (in the morning) 13-Apr-16 22:38  Centrum Silver Antioxidant Multiple Vitamins and Minerals oral tablet 1 tab(s) orally once a day (in the morning) 13-Apr-16 22:38   Allergies:  vancomycin: Hives  Allergies:  Allergies vancomycin   Social/Family History: Employment Status: retired  Lives With: children  Social History: no tob, no EtOH, no illicits  Family History: no seizures, no stroke   Vital Signs: **Vital Signs.:   14-Apr-16 05:35  Vital Signs Type Routine  Temperature Temperature (F) 98.2  Celsius 36.7  Pulse Pulse 63  Respirations Respirations 18  Systolic BP Systolic BP 130  Diastolic BP (mmHg) Diastolic BP (mmHg) 64  Mean BP 78  Pulse Ox % Pulse Ox % 91  Pulse Ox Activity Level  At rest  Oxygen Delivery Room Air/ 21 %   Physical Exam: General: nl weight, NAD  HEENT: normocephalic, sclera nonicteric, oropharynx clear  Neck: supple, no JVD, no bruits  Chest: CTA B, no wheezing  Cardiac: RRR, no murmurs, no edema, 2+ pulses  Extremities: no C/C/E, FROM   Neurologic  Exam: Mental Status: alert and oriented x 2 not time, normal language, follows complex commands, mild tremor in her voice, poor distant memory  Cranial Nerves: PERRLA, EOMI, nl VF, face symmetric, tongue midline, shoulder shrug equal  Motor Exam: 5/5 B, normal tone, moderate intention tremor with titubation  Deep Tendon Reflexes: 1+/4 B, plantars downgoing B, no Hoffman  Sensory Exam: pinprick, temperature, and vibration intact B  Coordination: FTN and HTS WNL   Lab Results: Hepatic:  13-Apr-16 20:17   Bilirubin, Total 0.7 (0.3-1.2 NOTE: New Reference Range  06/04/14)  Alkaline  Phosphatase 61 (38-126 NOTE: New Reference Range  06/04/14)  SGPT (ALT) 17 (14-54 NOTE: New Reference Range  06/04/14)  SGOT (AST) 22 (15-41 NOTE: New Reference Range  06/04/14)  Total Protein, Serum 7.2 (6.5-8.1 NOTE: New Reference Range  06/04/14)  Albumin, Serum 4.3 (3.5-5.0 NOTE: New reference range  06/04/14)  Routine Micro:  13-Apr-16 21:43   Specimen Source CLEAN CATCH    22:02   Micro Text Report BLOOD CULTURE   COMMENT                   NO GROWTH IN 8-12 HOURS   ANTIBIOTIC                       Culture Comment NO GROWTH IN 8-12 HOURS  Result(s) reported on 11 Jul 2014 at 06:00AM.  Routine Chem:  13-Apr-16 20:17   Magnesium, Serum 2.1 (1.7-2.4 THERAPEUTIC RANGE: 4-7 mg/dL TOXIC: > 10 mg/dL  ----------------------- NOTE: New Reference Range  06/04/14)  Phosphorus, Serum 3.1 (2.5-4.6 NOTE: New Reference Range  06/04/14)    22:02   Lactic Acid  Venous 1.0 (0.5-2.0 NOTE: New Reference Range:  06/04/14)  14-Apr-16 04:22   Cholesterol, Serum 146 (0-200 NOTE: New Reference Range  06/04/14)  Triglycerides, Serum 35 (0-149 NOTE: New Reference Range  06/04/14)  HDL (INHOUSE) 52 (40-1000 NOTE: New Reference Range:  06/04/14)  VLDL Cholesterol Calculated 7 (0-40 NOTE: New Reference Range  06/04/14)  LDL Cholesterol Calculated 87 (0-99 NOTE: New Reference Range:  06/04/14)  Glucose, Serum  158 (65-99 NOTE: New Reference Range  06/04/14)  BUN  22 (6-20 NOTE: New Reference Range  06/04/14)  Creatinine (comp)  1.50 (0.44-1.00 NOTE: New Reference Range  06/04/14)  Sodium, Serum 140 (135-145 NOTE: New Reference Range  06/04/14)  Potassium, Serum 4.2 (3.5-5.1 NOTE: New Reference Range  06/04/14)  Chloride, Serum 108 (101-111 NOTE: New Reference Range  06/04/14)  CO2, Serum 25 (22-32 NOTE: New Reference Range  06/04/14)  Calcium (Total), Serum  8.1 (8.9-10.3 NOTE: New Reference Range  06/04/14)  Anion Gap 7  eGFR (African American)  35  eGFR  (Non-African American)  30 (eGFR values <66m/min/1.73 m2 may be an indication of chronic kidney disease (CKD). Calculated eGFR is useful in patients with stable renal function. The eGFR calculation will not be reliable in acutely ill patients when serum creatinine is changing rapidly. It is not useful in patients on dialysis. The eGFR calculation may not be applicable to patients at the low and high extremes of body sizes, pregnant women, and vegetarians.)  Cardiac:  13-Apr-16 20:17   Troponin I <0.03 (0.00-0.03 0.03 ng/mL or less: NEGATIVE  Repeat testing in 3-6 hrs  if clinically indicated. >0.05 ng/mL: POTENTIAL  MYOCARDIAL INJURY. Repeat  testing in 3-6 hrs if  clinically indicated. NOTE: An increase or decrease  of 30% or more on serial  testing suggests a  clinically important  change NOTE: New Reference Range  06/04/14)  Routine UA:  13-Apr-16 21:43   Color (UA) Yellow  Clarity (UA) Clear  Glucose (UA) Negative  Bilirubin (UA) Negative  Ketones (UA) Negative  Specific Gravity (UA) 1.012  Blood (UA) 1+  pH (UA) 7.0  Protein (UA) Negative  Nitrite (UA) Negative  Leukocyte Esterase (UA) Negative (Result(s) reported on 10 Jul 2014 at 10:20PM.)  RBC (UA) 6-30  WBC (UA) 0-5  Bacteria (UA) NONE SEEN  Epithelial Cells (UA) 0-5  Mucous (UA) PRESENT (Result(s) reported on 10 Jul 2014 at 10:20PM.)  Routine Coag:  13-Apr-16 20:17   Prothrombin 12.7 (11.4-15.0 NOTE: New Reference Range  04/26/14)  INR 0.9 (INR reference interval applies to patients on anticoagulant therapy. A single INR therapeutic range for coumarins is not optimal for all indications; however, the suggested range for most indications is 2.0 - 3.0. Exceptions to the INR Reference Range may include: Prosthetic heart valves, acute myocardial infarction, prevention of myocardial infarction, and combinations of aspirin and anticoagulant. The need for a higher or lower target INR must be assessed  individually. Reference: The Pharmacology and Management of the Vitamin K  antagonists: the seventh ACCP Conference on Antithrombotic and Thrombolytic Therapy. KXFGH.8299 Sept:126 (3suppl): [id number]. A HCT value >55% may artifactually increase the PT.  In one study,  the increase was an average of 25%. Reference:  "Effect on Routine and Special Coagulation Testing Values of Citrate Anticoagulant Adjustment in Patients with High HCT Values." American Journal of Clinical Pathology 2006;126:400-405.)  Routine Hem:  13-Apr-16 20:17   Reference Accession# [id number] (Result(s) reported on 10 Jul 2014 at 09:53PM.)  14-Apr-16 04:22   WBC (CBC) 9.0  RBC (CBC)  3.54  Hemoglobin (CBC)  11.6  Hematocrit (CBC)  34.8  Platelet Count (CBC) 182  MCV 98  MCH 32.8  MCHC 33.4  RDW 13.2  Neutrophil % 87.1  Lymphocyte % 10.5  Monocyte % 2.3  Eosinophil % 0.0  Basophil % 0.1  Neutrophil #  7.8  Lymphocyte #  0.9  Monocyte # 0.2  Eosinophil # 0.0  Basophil # 0.0 (Result(s) reported on 11 Jul 2014 at 05:44AM.)   Radiology Results: [place]:    14-Apr-16 10:21, US Carotid Doppler Bilateral  US Carotid Doppler Bilateral   REASON FOR EXAM:    TIA  COMMENTS:       PROCEDURE: [place]  - US CAROTID DOPPLER BILATERAL  - Jul 11 2014 10:21AM     CLINICAL DATA:  81 year old female with a history of TIA.    Cardiovascular risk factors include hypertension.    EXAM:  BILATERAL CAROTID DUPLEX ULTRASOUND    TECHNIQUE:  Pearline Cables scale imaging, color Doppler and duplex ultrasound were  performed of bilateral carotid and vertebral arteries in the neck.  COMPARISON:  None    FINDINGS:  Criteria: Quantification of carotid stenosis is basedon velocity  parameters that correlate the residual internal carotid diameter  with NASCET-based stenosis levels, using the diameter of the distal  internal carotid lumen as the denominator for stenosis measurement.    The following velocity measurements were  obtained:    RIGHT    ICA:  Systolic 938 cm/sec, Diastolic 33 cm/sec    CCA:  84 cm/sec  SYSTOLIC ICA/CCA RATIO:  1.01    ECA:  99 cm/sec    LEFT    ICA:  Systolic 751 cm/sec, Diastolic 24 cm/sec    CCA:  94 cm/sec    SYSTOLIC ICA/CCA RATIO: 0.25    ECA:  93 cm/sec    Right Brachial SBP: Not acquired  Left Brachial SBP: Not acquired    RIGHT CAROTID ARTERY: Atherosclerotic changes of the left common  carotid artery. Intermediate waveform maintained. Heterogeneous and  partially calcified plaque at the right carotid bifurcation. Low  resistance waveform of the right ICA. No significant tortuosity.    RIGHT VERTEBRAL ARTERY: Antegrade flow with low resistance waveform.    LEFT CAROTID ARTERY: Atherosclerotic changes of the left common  carotid artery. Intermediate waveform maintained. Heterogeneous and  partially calcified plaque at the left carotid bifurcation. Plaque  on the anterior wall contributes to shadowing across the lumen. Low  resistance waveform of the left ICA. Tortuosity of the left ICA.  LEFT VERTEBRAL ARTERY:  Antegrade flow with low resistance waveform.    Additional:  Left-sided thyroid nodule identified.     IMPRESSION:  Right:    Heterogeneous and partially calcified plaque of the right carotid  bifurcationcontributes 50% - 69% stenosis by duplex criteria.    Left:    Color duplex indicates moderate heterogeneous and calcified plaque,  with no hemodynamically significant stenosis by duplex criteria in  the extracranial cerebrovascular circulation. However, flow  velocities of the left ICA were obtained from an area distal to the  maximum narrowing due to the presence of anterior wall plaque with  shadowing and may be underestimating the percentage of ICA stenosis.  If further evaluation is warranted, formal cerebral angiogram may be  considered as the most sensitive test for carotid evaluation. As a  secondary test, CT  angiogram.    Signed,    Dulcy Fanny. Earleen Newport, DO    [facility]    [facility]  Electronically Signed    By: Corrie Mckusick D.O.    On: 07/11/2014 10:43         Verified By: Johny Shears, M.D.,  MRI:    14-Apr-16 09:21, MRI Brain Without Contrast  MRI Brain Without Contrast   REASON FOR EXAM:    CVA  COMMENTS:       PROCEDURE: MR  - MR BRAIN WO CONTRAST  - Jul 11 2014  9:21AM     CLINICAL DATA:  81 year old female with slurred speech yesterday and  new onset weakness. Initial encounter.    EXAM:  MRI HEAD WITHOUT CONTRAST    TECHNIQUE:  Multiplanar, multiecho pulse sequences of the brain and surrounding  structures were obtained without intravenous contrast.  COMPARISON:  Head CTs without contrast 07/10/2014 and earlier.    FINDINGS:  Cerebral volume is within normal limits for age. No restricted  diffusion to suggest acute infarction. No midline shift, mass  effect, evidence of mass lesion, ventriculomegaly, extra-axial  collection or acute intracranial hemorrhage. Cervicomedullary  junction and pituitary are within normal limits. Negative visualized  cervical spine. Major intracranial vascular flow voids are within  normal limits.    Small area of cortical encephalomalacia in the medial right  occipital lobe near the tentorium. Small area of cortical  encephalomalacia also in the left parietal lobe best seen on series  9, image 17. Patchy T2 and FLAIR hyperintensity in the cerebral  white matter is mild to moderate for age. No chronic blood products  identified in the brain. Mildly increased perivascularspaces in the  deep gray matter nuclei. Brainstem within normal limits. Small  chronic lacunar infarct in the posterior cerebellum on series 9,  image 6.    Visible internal auditory structures appear normal. Postoperative  changes to  the globes. Mild to moderate paranasal sinus mucosal  thickening. Trace mastoid  fluid on the right. Negative visualized  nasopharynx. Visualized scalp soft tissues are within normal limits.  Normal bone marrow signal.     IMPRESSION:  1.  No acute intracranial abnormality.  2. Moderate for age chronic ischemic disease.  Electronically Signed    By: Genevie Ann M.D.    On: 07/11/2014 09:34         Verified By: Gwenyth Bender. HALL, M.D.,  CT:    13-Apr-16 20:48, CT Head Without Contrast  CT Head Without Contrast   REASON FOR EXAM:    episode of slurry speech  COMMENTS:       PROCEDURE: CT  - CT HEAD WITHOUT CONTRAST  - Jul 10 2014  8:48PM     CLINICAL DATA:  Fall now with confusion    EXAM:  CT HEAD WITHOUT CONTRAST    TECHNIQUE:  Contiguous axial images were obtained from the base of the skull  through the vertex without intravenous contrast.    COMPARISON:  06/04/2013  FINDINGS:  Prominence of the sulci and ventricles consistent with brain  atrophy. There is diffuse low attenuation throughout the subcortical  and periventricular white matter consistent with chronic  microvascular disease. There is no acute intracranial hemorrhage,  infarct or mass identified. There is chronic mucoperiosteal  thickening and opacification involving the left maxillary sinus. The  remaining paranasal sinuses are clear. The mastoid air cells are  clear. The calvarium is intact.     IMPRESSION:  1. No acute intracranial abnormalities.  2. Chronic small vessel ischemic disease and brain atrophy.  3. Left maxillary sinusdisease, chronic  Electronically Signed    By: Kerby Moors M.D.    On: 07/10/2014 21:18         Verified By: Angelita Ingles, M.D.,   Radiology Impression: Radiology Impression: MRI personally reviewed by me and shows chronic white matter changes, no acute stroke   Impression/Recommendations: Recommendations:   prior notes reviewed by me reviewed by me   Slurred speech-  could be from tremor or even medications; resolved and no obvious TIA/stroke  Mild white matter changes-  asymptomatic Mild cognitive deficit-  stable Essential tremor-  uncontrolled but pt is used to it would wean Gabapentin start Primidone 53m PO qHS x 1 week then  start ASA 864mdaily will sign off, [facility] please call with questions  Electronic Signatures: SmJamison NeighborMD)  (Signed 14-Apr-16 13:21)  Authored: REFERRING PHYSICIAN, Primary Care Physician, Consult, History of Present Illness, Review of Systems, PAST MEDICAL/SURGICAL HISTORY, HOME MEDICATIONS, ALLERGIES, Social/Family History, NURSING VITAL SIGNS, Physical Exam-, LAB RESULTS, RADIOLOGY RESULTS, Recommendations   Last Updated: 14-Apr-16 13:21 by SmJamison NeighborMD)

## 2014-07-28 NOTE — Discharge Summary (Signed)
PATIENT NAMETAMESHIA, [name] MR#:  [id number] DATE OF BIRTH:  1920/08/13  DATE OF ADMISSION:  07/10/2014 DATE OF DISCHARGE:  07/12/2014  DISCHARGE DIAGNOSES: 1.  Altered mental status, possible transient ischemic attack.  2.  Chronic tremor.  3.  Arthritis.  4.  Hypertension.  5.  Acute sinusitis.  DISCHARGE MEDICATIONS:  1.  Vitamin D3 1,000 units daily. 2.  Propranolol 20 mg daily. 3.  Lisinopril 10 mg daily. 4.  Gabapentin 100 mg two in the morning, three at bedtime. 5.  Amlodipine 5 mg daily.  6.  Aspirin 81 mg daily.  7.  Propranolol 20 mg every a.m.  8.  Calcium and vitamin D b.i.d.  9.  Ceftin 250 mg b.i.d. x1 week.   HISTORY OF PRESENT ILLNESS: Please see H and P for HPI, past medical history, and physical exam.   HOSPITAL COURSE: The patient was admitted. Her slurred speech and confusion had resolved. She was maintained on a baby aspirin. Carotid Doppler showed 50% to 60%, bilateral partial stenosis, and brain MRI showed chronic ischemic disease, but no acute infarct. Neurology agreed with the baby aspirin. It was unsure what the cause of this spell was, possibly was a TIA and thus the baby aspirin was added. The patient is uninterested in changing her medications for tremor.   OVERALL PROGNOSIS: Guarded.   DISCHARGE FOLLOWUP: She follow up with Dr. Sabra Heck in 1 week.   ____________________________ Rusty Aus, MD mfm:sb D: 07/12/2014 08:05:53 ET T: 07/12/2014 12:45:26 ET JOB#: [id number]  cc: Rusty Aus, MD, <Dictator> MARK Roselee Culver MD ELECTRONICALLY SIGNED 07/16/2014 8:30

## 2014-10-21 ENCOUNTER — Ambulatory Visit (INDEPENDENT_AMBULATORY_CARE_PROVIDER_SITE_OTHER): Payer: Medicare Other | Admitting: Podiatry

## 2014-10-21 ENCOUNTER — Encounter: Payer: Self-pay | Admitting: Podiatry

## 2014-10-21 DIAGNOSIS — B351 Tinea unguium: Secondary | ICD-10-CM

## 2014-10-21 DIAGNOSIS — Q828 Other specified congenital malformations of skin: Secondary | ICD-10-CM

## 2014-10-21 DIAGNOSIS — M79676 Pain in unspecified toe(s): Secondary | ICD-10-CM

## 2014-10-21 NOTE — Progress Notes (Signed)
Patient presents to the office today with a chief complaint of painful elongated toenails.  Objective: Pulses are palpable bilateral. Nails are thick yellow dystrophic clinically mycotic and painful palpation.  Assessment: Pain in limb secondary to onychomycosis 1 through 5 bilateral.  Plan: Debridement of nails 1 through 5 bilateral covered service secondary to pain.  

## 2015-01-27 ENCOUNTER — Ambulatory Visit: Payer: Medicare Other | Admitting: Podiatry

## 2015-02-12 ENCOUNTER — Encounter: Payer: Self-pay | Admitting: Podiatry

## 2015-02-12 ENCOUNTER — Ambulatory Visit (INDEPENDENT_AMBULATORY_CARE_PROVIDER_SITE_OTHER): Payer: Medicare Other | Admitting: Podiatry

## 2015-02-12 DIAGNOSIS — M79676 Pain in unspecified toe(s): Secondary | ICD-10-CM | POA: Diagnosis not present

## 2015-02-12 DIAGNOSIS — B351 Tinea unguium: Secondary | ICD-10-CM

## 2015-02-12 NOTE — Progress Notes (Signed)
She presents today to complaint of painful elongated toenails.  Objective: Vital signs stable alert and oriented 3. Pulses are strongly palpable. Nails are thick yellow dystrophic with mycotic and painful palpation.  Assessment: Pain in limb second onychomycosis bilateral.  Plan: Debridement of nails 1 through 5 bilateral.

## 2015-03-06 ENCOUNTER — Encounter: Payer: Self-pay | Admitting: Podiatry

## 2015-03-06 ENCOUNTER — Ambulatory Visit (INDEPENDENT_AMBULATORY_CARE_PROVIDER_SITE_OTHER): Payer: Medicare Other | Admitting: Podiatry

## 2015-03-06 ENCOUNTER — Ambulatory Visit (INDEPENDENT_AMBULATORY_CARE_PROVIDER_SITE_OTHER): Payer: Medicare Other

## 2015-03-06 VITALS — BP 155/69 | HR 61 | Resp 18

## 2015-03-06 DIAGNOSIS — R52 Pain, unspecified: Secondary | ICD-10-CM

## 2015-03-06 DIAGNOSIS — S92911A Unspecified fracture of right toe(s), initial encounter for closed fracture: Secondary | ICD-10-CM

## 2015-03-07 NOTE — Progress Notes (Signed)
Patient ID: LARSON KLEINER, female   DOB: Oct 20, 1920, 81 y.o.   MRN: [id number]  Subjective: 81 year old female presents the office of the daughter for concerns of possible fracture to the right foot. She states that over the weekend she had injury which she bent her toes backwards resulting in injury. After that she states her foot became swollen as well as very bruised. She went to the [facility] and she had x-rays taken she states there is no fracture. She was placed into a surgical shoe and she follows up today. She states the swelling and the bruising has significantly improved compared to what it previously was. No other complaints at this time.  Objective: AAO 3, NAD DP/PT pulses palpable 2/4, CRT less than 3 seconds Protective sensation appears to be intact with Derrel Nip monofilament There is moderate edema to the right forefoot and there is ecchymosis along the digit into the lateral aspect of the foot. There is tenderness palpation on the fourth and fifth digits and is somewhat degree the fourth metatarsal head. There is no other specific area pinpoint bony tenderness or pain the vibratory sensation. There is no pain with MPJ range of motion. No pain along the rear foot or ankle. No open lesions or pre-ulcerative lesions. There is no pain with calf compression, swelling, warmth, erythema.  Assessment: 81 year old female with toe fracture, possible 4th metatarsal fracture.   Plan: -Treatment options discussed including all alternatives, risks, and complications -X-rays were obtained and reviewed with the patient.  There does appear to be a fracture the fourth digit and possible old fracture the fifth digit. There is also a questionable area in the fourth metatarsal head. Osteopenia is present. -At this time recommended ice elevation. Continue with surgical shoe At all times. -Follow-up in 3 weeks or sooner if any problems arise. In the meantime, encouraged to call the  office with any questions, concerns, change in symptoms.  *x-ray right foot next appointment.   Celesta Gentile, DPM

## 2015-03-27 ENCOUNTER — Ambulatory Visit (INDEPENDENT_AMBULATORY_CARE_PROVIDER_SITE_OTHER): Payer: Medicare Other

## 2015-03-27 ENCOUNTER — Ambulatory Visit (INDEPENDENT_AMBULATORY_CARE_PROVIDER_SITE_OTHER): Payer: Medicare Other | Admitting: Podiatry

## 2015-03-27 DIAGNOSIS — S92911A Unspecified fracture of right toe(s), initial encounter for closed fracture: Secondary | ICD-10-CM | POA: Diagnosis not present

## 2015-03-27 DIAGNOSIS — R52 Pain, unspecified: Secondary | ICD-10-CM

## 2015-03-27 DIAGNOSIS — S92911D Unspecified fracture of right toe(s), subsequent encounter for fracture with routine healing: Secondary | ICD-10-CM | POA: Diagnosis not present

## 2015-03-31 DIAGNOSIS — S92919A Unspecified fracture of unspecified toe(s), initial encounter for closed fracture: Secondary | ICD-10-CM | POA: Insufficient documentation

## 2015-03-31 NOTE — Progress Notes (Signed)
Patient ID: [name], female   DOB: 12/10/1920, 82 y.o.   MRN: [id number]  Subjective: 82 year old female presents the office of the daughter for follow-up evaluation of right fourth toe fracture. She states that overall she is doing better. She did try to go back into a regular shoe however the surgical shoe is more comfortable for her. States the swelling has decreased as well as the pain. No other complaints at this time.  Objective: AAO 3, NAD DP/PT pulses palpable 2/4, CRT less than 3 seconds Protective sensation appears to be intact with Derrel Nip monofilament There is decreased edema to the right forefoot and there is no ecchymosis present at this time. There is mild tenderness palpation on the fourth digits. There is no pain to the other digits or the metatarsals. There is no other specific area pinpoint bony tenderness or pain the vibratory sensation. There is no pain with MPJ range of motion. No pain along the rear foot or ankle. No open lesions or pre-ulcerative lesions. There is no pain with calf compression, swelling, warmth, erythema.  Assessment: 82 year old female with toe fracture, resolving  Plan: -Treatment options discussed including all alternatives, risks, and complications -X-rays were obtained and reviewed with the patient.  Again demonstrated is a likely fracture the fourth digit. Osteopenia is present. -Continue a surgical shoe. She concerned transition to a regular shoe as tolerated. Discussed with her daughter daughter how to tape the toes. -At this time recommended ice elevation.  -Follow-up as scheduled or sooner if any problems arise. In the meantime, encouraged to call the office with any questions, concerns, change in symptoms.  *x-ray right foot next appointment.   Celesta Gentile, DPM

## 2015-05-01 ENCOUNTER — Ambulatory Visit: Payer: Medicare Other | Admitting: Podiatry

## 2015-05-19 ENCOUNTER — Encounter: Payer: Self-pay | Admitting: Podiatry

## 2015-05-19 ENCOUNTER — Ambulatory Visit (INDEPENDENT_AMBULATORY_CARE_PROVIDER_SITE_OTHER): Payer: Medicare Other | Admitting: Podiatry

## 2015-05-19 DIAGNOSIS — B351 Tinea unguium: Secondary | ICD-10-CM

## 2015-05-19 DIAGNOSIS — M79676 Pain in unspecified toe(s): Secondary | ICD-10-CM | POA: Diagnosis not present

## 2015-05-19 DIAGNOSIS — L603 Nail dystrophy: Secondary | ICD-10-CM

## 2015-05-19 NOTE — Progress Notes (Signed)
She presents today for chief complaint of painful elongated toenails.  Objective: Vital signs are stable alert and oriented 3. Pulses are palpable. Neurologic sensorium is intact. Nails are thick yellow dystrophic onychomycotic and painful.  Assessment: Pain in limb secondary to onychomycosis.  Plan: Debridement of toenails 1 through 5 bilateral.

## 2015-08-20 ENCOUNTER — Ambulatory Visit (INDEPENDENT_AMBULATORY_CARE_PROVIDER_SITE_OTHER): Payer: Medicare Other | Admitting: Podiatry

## 2015-08-20 ENCOUNTER — Encounter: Payer: Self-pay | Admitting: Podiatry

## 2015-08-20 DIAGNOSIS — M79676 Pain in unspecified toe(s): Secondary | ICD-10-CM | POA: Diagnosis not present

## 2015-08-20 DIAGNOSIS — B351 Tinea unguium: Secondary | ICD-10-CM | POA: Diagnosis not present

## 2015-08-20 NOTE — Progress Notes (Signed)
She presents today with chief complaint of painful elongated toenails. She would like to have them cut.  Objective: Vital signs are stable she is alert and oriented 3. Pulses are palpable. Toenails are thick yellow dystrophic with mycotic. She has hammertoe deformities bilateral.  Assessment: Pain and limp secondary to onychomycosis.  Plan: Debrided toenails 1 through 5 bilateral cover service secondary to pain follow up with her in 3 months.

## 2015-09-14 ENCOUNTER — Emergency Department: Payer: Medicare Other

## 2015-09-14 ENCOUNTER — Emergency Department
Admission: EM | Admit: 2015-09-14 | Discharge: 2015-09-14 | Disposition: A | Discharge status: Other Acute Inpatient Hospital | Payer: Medicare Other | Attending: Emergency Medicine | Admitting: Emergency Medicine

## 2015-09-14 DIAGNOSIS — E785 Hyperlipidemia, unspecified: Secondary | ICD-10-CM | POA: Diagnosis not present

## 2015-09-14 DIAGNOSIS — I619 Nontraumatic intracerebral hemorrhage, unspecified: Secondary | ICD-10-CM | POA: Insufficient documentation

## 2015-09-14 DIAGNOSIS — Z79899 Other long term (current) drug therapy: Secondary | ICD-10-CM | POA: Diagnosis not present

## 2015-09-14 DIAGNOSIS — I629 Nontraumatic intracranial hemorrhage, unspecified: Secondary | ICD-10-CM

## 2015-09-14 DIAGNOSIS — R531 Weakness: Secondary | ICD-10-CM | POA: Insufficient documentation

## 2015-09-14 DIAGNOSIS — R4182 Altered mental status, unspecified: Secondary | ICD-10-CM | POA: Diagnosis present

## 2015-09-14 DIAGNOSIS — R410 Disorientation, unspecified: Secondary | ICD-10-CM | POA: Insufficient documentation

## 2015-09-14 DIAGNOSIS — Z7982 Long term (current) use of aspirin: Secondary | ICD-10-CM | POA: Diagnosis not present

## 2015-09-14 HISTORY — DX: Diverticulitis of intestine, part unspecified, without perforation or abscess without bleeding: K57.92

## 2015-09-14 HISTORY — DX: Malignant (primary) neoplasm, unspecified: C80.1

## 2015-09-14 LAB — DIFFERENTIAL
Basophils Absolute: 0 10*3/uL (ref 0–0.1)
Basophils Relative: 0 %
Eosinophils Absolute: 0.1 10*3/uL (ref 0–0.7)
Eosinophils Relative: 1 %
LYMPHS ABS: 0.9 10*3/uL — AB (ref 1.0–3.6)
Monocytes Absolute: 0.4 10*3/uL (ref 0.2–0.9)
Monocytes Relative: 3 %
NEUTROS ABS: 11.1 10*3/uL — AB (ref 1.4–6.5)

## 2015-09-14 LAB — COMPREHENSIVE METABOLIC PANEL
ALBUMIN: 4.1 g/dL (ref 3.5–5.0)
ALK PHOS: 49 U/L (ref 38–126)
ALT: 17 U/L (ref 14–54)
AST: 27 U/L (ref 15–41)
Anion gap: 10 (ref 5–15)
BILIRUBIN TOTAL: 0.7 mg/dL (ref 0.3–1.2)
BUN: 23 mg/dL — AB (ref 6–20)
CALCIUM: 9.9 mg/dL (ref 8.9–10.3)
CO2: 26 mmol/L (ref 22–32)
CREATININE: 1.61 mg/dL — AB (ref 0.44–1.00)
Chloride: 103 mmol/L (ref 101–111)
GFR calc Af Amer: 31 mL/min — ABNORMAL LOW (ref >60)
GFR calc non Af Amer: 27 mL/min — ABNORMAL LOW (ref >60)
GLUCOSE: 147 mg/dL — AB (ref 65–99)
Potassium: 4.7 mmol/L (ref 3.5–5.1)
SODIUM: 139 mmol/L (ref 135–145)
TOTAL PROTEIN: 6.6 g/dL (ref 6.5–8.1)

## 2015-09-14 LAB — URINALYSIS COMPLETE WITH MICROSCOPIC (ARMC ONLY)
Bacteria, UA: NONE SEEN
Bilirubin Urine: NEGATIVE
GLUCOSE, UA: NEGATIVE mg/dL
HGB URINE DIPSTICK: NEGATIVE
Ketones, ur: NEGATIVE mg/dL
Leukocytes, UA: NEGATIVE
NITRITE: NEGATIVE
Protein, ur: NEGATIVE mg/dL
SPECIFIC GRAVITY, URINE: 1.012 (ref 1.005–1.030)
Squamous Epithelial / LPF: NONE SEEN
pH: 7 (ref 5.0–8.0)

## 2015-09-14 LAB — PROTIME-INR
INR: 0.94
PROTHROMBIN TIME: 12.8 s (ref 11.4–15.0)

## 2015-09-14 LAB — CBC
HCT: 35.8 % (ref 35.0–47.0)
Hemoglobin: 12 g/dL (ref 12.0–16.0)
MCH: 31.7 pg (ref 26.0–34.0)
MCHC: 33.4 g/dL (ref 32.0–36.0)
MCV: 94.7 fL (ref 80.0–100.0)
PLATELETS: 248 10*3/uL (ref 150–440)
RBC: 3.78 MIL/uL — AB (ref 3.80–5.20)
RDW: 13.5 % (ref 11.5–14.5)
WBC: 12.6 10*3/uL — AB (ref 3.6–11.0)

## 2015-09-14 LAB — TROPONIN I: Troponin I: <0.03 ng/mL (ref <0.031)

## 2015-09-14 LAB — APTT: aPTT: 27 seconds (ref 24–36)

## 2015-09-14 LAB — LACTIC ACID, PLASMA: LACTIC ACID, VENOUS: 2.1 mmol/L — AB (ref 0.5–2.0)

## 2015-09-14 MED ORDER — SODIUM CHLORIDE 0.9 % IV BOLUS (SEPSIS)
1000.0000 mL | Freq: Once | INTRAVENOUS | Status: AC
  Administered 2015-09-14: 1000 mL via INTRAVENOUS

## 2015-09-14 MED ORDER — SODIUM CHLORIDE 0.9 % IV BOLUS (SEPSIS)
250.0000 mL | Freq: Once | INTRAVENOUS | Status: AC
  Administered 2015-09-14: 250 mL via INTRAVENOUS

## 2015-09-14 MED ORDER — SODIUM CHLORIDE 0.9 % IV BOLUS (SEPSIS)
500.0000 mL | Freq: Once | INTRAVENOUS | Status: AC
  Administered 2015-09-14: 500 mL via INTRAVENOUS

## 2015-09-14 NOTE — ED Notes (Signed)
Janett Billow at [facility] notified that pt being transported ATT by Ronalee Belts, Medic, [place], OTF att, Solmon Ice, RN notified

## 2015-09-14 NOTE — ED Notes (Signed)
Pt alert and orientedx4. Per family, pts speech sounds more normal. Pt temperature 98.7.

## 2015-09-14 NOTE — ED Provider Notes (Signed)
[facility] Emergency Department Provider Note    Level V caveat: Review of systems and history is limited by altered mental status    Time seen: ----------------------------------------- 4:48 PM on 09/14/2015 -----------------------------------------    I have reviewed the triage vital signs and the nursing notes.   HISTORY  Chief Complaint Altered Mental Status and Code Stroke    HPI [name] is a 82 y.o. female who arrived to the ER by private vehicle for weakness and confusion with speech change. Family states she went to church, had lunch and wash the dishes. She went to the bathroom, had diarrhea and that her symptoms started. She is unable to elaborate at this time due to altered mental status   No past medical history on file.  Patient Active Problem List   Diagnosis Date Noted  . Toe fracture 03/31/2015  . H/O transient cerebral ischemia 07/25/2014  . 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 Benign essential HTN 11/13/2013  . Cervical spinal stenosis 08/23/2013  . Chronic kidney disease (CKD), stage III (moderate) 08/23/2013  . Malignant neoplasm of colon ([place]) 08/23/2013  . Diverticular hemorrhage 08/23/2013  . HLD (hyperlipidemia) 08/23/2013  . Multinodular goiter 08/23/2013  . Calculus of kidney 08/23/2013  . OP (osteoporosis) 08/23/2013  . Benign essential tremor 08/23/2013    No past surgical history on file.  Allergies Donepezil and Vancomycin  Social History Social History  Substance Use Topics  . Smoking status: Never Smoker   . Smokeless tobacco: Not on file  . Alcohol Use: No    Review of Systems Constitutional: Positive for fever Neurological: Positive for confusion, weakness, speech disturbance  Review of systems otherwise unknown at this time  ____________________________________________   PHYSICAL EXAM:  VITAL SIGNS: ED Triage Vitals  Enc Vitals Group     BP --      Pulse --      Resp --      Temp --       Temp src --      SpO2 --      Weight --      Height --      Head Cir --      Peak Flow --      Pain Score --      Pain Loc --      Pain Edu? --      Excl. in [place]? --     Constitutional: Alert But disoriented. No acute distress Eyes: Conjunctivae are normal. PERRL. Normal extraocular movements. ENT   Head: Normocephalic and atraumatic.   Nose: No congestion/rhinnorhea.   Mouth/Throat: Mucous membranes are moist.   Neck: No stridor. Cardiovascular: Normal rate, regular rhythm. No murmurs, rubs, or gallops. Respiratory: Normal respiratory effort without tachypnea nor retractions. Breath sounds are clear and equal bilaterally. No wheezes/rales/rhonchi. Gastrointestinal: Soft and nontender. Normal bowel sounds Musculoskeletal: Nontender with normal range of motion in all extremities. No lower extremity tenderness nor edema. Neurologic:  Normal speech and language. Asterixis is noted Skin:  Skin is warm, dry and intact. No rash noted. Psychiatric: Mood and affect are normal. Speech and behavior are normal.  ____________________________________________  EKG: Interpreted by me. Sinus rhythm rate 82 bpm, baseline artifact, left axis deviation, LVH  ____________________________________________  ED COURSE:  Pertinent labs & imaging results that were available during my care of the patient were reviewed by me and considered in my medical decision making (see chart for details). Patient presents to ER with altered mental status and fever.  We'll begin sepsis protocol. ____________________________________________    LABS (pertinent positives/negatives)  Labs Reviewed  CBC - Abnormal; Notable for the following:    WBC 12.6 (*)    RBC 3.78 (*)    All other components within normal limits  DIFFERENTIAL - Abnormal; Notable for the following:    Neutro Abs 11.1 (*)    Lymphs Abs 0.9 (*)    All other components within normal limits  COMPREHENSIVE METABOLIC PANEL -  Abnormal; Notable for the following:    Glucose, Bld 147 (*)    BUN 23 (*)    Creatinine, Ser 1.61 (*)    GFR calc non Af Amer 27 (*)    GFR calc Af Amer 31 (*)    All other components within normal limits  LACTIC ACID, PLASMA - Abnormal; Notable for the following:    Lactic Acid, Venous 2.1 (*)    All other components within normal limits  URINALYSIS COMPLETEWITH MICROSCOPIC ([facility] ONLY) - Abnormal; Notable for the following:    Color, Urine YELLOW (*)    APPearance HAZY (*)    All other components within normal limits  BLOOD GAS, VENOUS - Abnormal; Notable for the following:    pH, Ven 7.55 (*)    pCO2, Ven 27 (*)    pO2, Ven 119.0 (*)    All other components within normal limits  CULTURE, BLOOD (ROUTINE X 2)  CULTURE, BLOOD (ROUTINE X 2)  URINE CULTURE  PROTIME-INR  APTT  TROPONIN I  LACTIC ACID, PLASMA  CBG MONITORING, ED    RADIOLOGY  CT head IMPRESSION: Foci of intraparenchymal hemorrhage within the right temporal lobe, which may represent early hemorrhagic infarct, hypertensive hemorrhage or less likely small aneurysm rupture.  Atrophy, chronic microvascular disease.  Critical Value/emergent results were called by telephone at the time of interpretation on 09/14/2015 at 4:44 pm to an ER physician, who verbally acknowledged these results.  IMPRESSION: Scattered areas of intracranial hemorrhage involving the right temporal lobe with progression since 2016. Progression of encephalomalacia right temporal lobe. This may be an area of chronic infarct with hemorrhage. Cerebral amyloid with hemorrhage also possible. There are scattered areas of small acute hemorrhage in the right temporal lobe based on high density on CT.  Scattered areas of subarachnoid hemorrhage or not present 2016. These could be areas of chronic hemorrhage and hemosiderin.  Negative for acute ischemic infarction  Atrophy and chronic ischemic changes. Progression of hyper intensity in the  right medial occipital lobe presumably area of chronic infarction with progression.  Postcontrast imaging may be helpful for further evaluation given the complexity of findings.  CRITICAL CARE Performed by: Earleen Newport   Total critical care time: 30 minutes  Critical care time was exclusive of separately billable procedures and treating other patients.  Critical care was necessary to treat or prevent imminent or life-threatening deterioration.  Critical care was time spent personally by me on the following activities: development of treatment plan with patient and/or surrogate as well as nursing, discussions with consultants, evaluation of patient's response to treatment, examination of patient, obtaining history from patient or surrogate, ordering and performing treatments and interventions, ordering and review of laboratory studies, ordering and review of radiographic studies, pulse oximetry and re-evaluation of patient's condition.  ____________________________________________  FINAL ASSESSMENT AND PLAN  Altered mental status, fever, intraparenchymal hemorrhage  Plan: Patient with labs and imaging as dictated above. Patient was CT findings which were indeterminate, therefore MRI was performed here. No clear source for her fever  is identified at this time. Family has requested transfer to [facility]. I will discuss with the transfer center. She is currently medically stable, is more alert and oriented at this time.   Earleen Newport, MD   Note: This dictation was prepared with Dragon dictation. Any transcriptional errors that result from this process are unintentional   Earleen Newport, MD 09/14/15 1910

## 2015-09-14 NOTE — Progress Notes (Signed)
   09/14/15 1630  Clinical Encounter Type  Visited With Patient and family together  Visit Type ED  Referral From Nurse  Consult/Referral To Chaplain  Spiritual Encounters  Spiritual Needs Other (Comment) (None noted)  Stress Factors  Patient Stress Factors Health changes  Family Stress Factors None identified  Met w/family on arrival. No needs were identified. Family was comfortable and medical staff had comforted Ms. [name]. Departed to respond to second call. Chap. Zoltan Genest G[place]

## 2015-09-14 NOTE — ED Notes (Signed)
Pt transported to MRI 

## 2015-09-14 NOTE — ED Notes (Signed)
Pt came to ED via pov. Pt went to church, had lunch, did dishes. Went to bathroom, has loose BM and became weak and more confused with speech change.

## 2015-09-15 LAB — BLOOD GAS, VENOUS
ACID-BASE EXCESS: 2.1 mmol/L (ref 0.0–3.0)
BICARBONATE: 23.6 meq/L (ref 21.0–28.0)
FIO2: 0.21
O2 SAT: 99.1 %
PCO2 VEN: 27 mmHg — AB (ref 44.0–60.0)
PO2 VEN: 119 mmHg — AB (ref 31.0–45.0)
Patient temperature: 37
pH, Ven: 7.55 — ABNORMAL HIGH (ref 7.320–7.430)

## 2015-09-16 LAB — URINE CULTURE

## 2015-09-19 LAB — CULTURE, BLOOD (ROUTINE X 2)
CULTURE: NO GROWTH
Culture: NO GROWTH

## 2015-10-29 ENCOUNTER — Encounter
Admission: RE | Admit: 2015-10-29 | Discharge: 2015-10-29 | Disposition: A | Discharge status: Home / Self Care | Payer: Medicare Other | Source: Ambulatory Visit | Attending: Internal Medicine | Admitting: Internal Medicine

## 2015-11-12 ENCOUNTER — Ambulatory Visit: Payer: Medicare Other | Admitting: Podiatry

## 2015-11-13 ENCOUNTER — Non-Acute Institutional Stay (SKILLED_NURSING_FACILITY): Payer: Medicare Other | Admitting: Gerontology

## 2015-11-13 DIAGNOSIS — B951 Streptococcus, group B, as the cause of diseases classified elsewhere: Secondary | ICD-10-CM | POA: Diagnosis not present

## 2015-11-13 DIAGNOSIS — I10 Essential (primary) hypertension: Secondary | ICD-10-CM | POA: Diagnosis not present

## 2015-11-13 DIAGNOSIS — R7881 Bacteremia: Secondary | ICD-10-CM | POA: Diagnosis not present

## 2015-11-13 NOTE — Progress Notes (Signed)
Location:      Place of Service:  SNF (31) Provider:  Toni Arthurs, NP-C  Rusty Aus, MD  Patient Care Team: Rusty Aus, MD as PCP - General (Internal Medicine)  Extended Emergency Contact Information Primary Emergency Contact: Joycelyn Schmid Address: [address]          [place], [place] [address] [place] Phone: [phone] Relation: Daughter  Code Status:  DNR Goals of care: Advanced Directive information Advanced Directives 09/14/2015  Does patient have an advance directive? No     Chief Complaint  Patient presents with  . Follow-up    HPI:  Pt is a 82 y.o. female seen today for medical management of chronic diseases. She has a h/o Hypertension, which is well controlled on home regimen of antihypertensives. She is at the rehab facility to receive IV antibiotics for Bacteremia due to group B streptococcus. She has a single lumen PICC in the right upper arm. Says she is feeling well and is looking forward to going home. Pain is well controlled and not having any dyspnea. VSS     Past Medical History:  Diagnosis Date  . Cancer [facility])    colon cancer  . Diverticulitis    Past Surgical History:  Procedure Laterality Date  . APPENDECTOMY    . CHOLECYSTECTOMY    . HIP SURGERY    . TONSILLECTOMY      Allergies  Allergen Reactions  . Donepezil Nausea Only  . Vancomycin Hives      Medication List       Accurate as of 11/13/15 11:21 PM. Always use your most recent med list.          acetaminophen 500 MG tablet Commonly known as:  TYLENOL Take by mouth.   amLODipine 5 MG tablet Commonly known as:  NORVASC Take 10 mg by mouth.   aspirin EC 81 MG tablet Take by mouth.   Biotin 1000 MCG tablet Take by mouth.   CALCIUM 500/D 500-200 MG-UNIT tablet Generic drug:  calcium-vitamin D Take by mouth.   denosumab 60 MG/ML Soln injection Commonly known as:  PROLIA Inject 60 mg into the skin.   docusate sodium 100 MG  capsule Commonly known as:  COLACE Take by mouth.   gabapentin 300 MG capsule Commonly known as:  NEURONTIN Take by mouth.   Ginkgo Biloba 40 MG Tabs Take by mouth.   lidocaine 5 % Commonly known as:  Tiburones onto the skin.   lisinopril 10 MG tablet Commonly known as:  PRINIVIL,ZESTRIL TAKE 1 TABLET ONE TIME DAILY   MULTI-VITAMINS Tabs Take by mouth.   polyethylene glycol packet Commonly known as:  MIRALAX / GLYCOLAX Take by mouth.   propranolol 20 MG tablet Commonly known as:  INDERAL Take by mouth.   traMADol 50 MG tablet Commonly known as:  ULTRAM   VITAMIN D-1000 MAX ST 1000 units tablet Generic drug:  Cholecalciferol Take by mouth.   Vitamin-B Complex Tabs Take by mouth.       Review of Systems  Constitutional: Negative for activity change, appetite change, chills, diaphoresis and fever.  HENT: Negative for congestion, sneezing, sore throat, trouble swallowing and voice change.   Respiratory: Negative for apnea, cough, choking, chest tightness, shortness of breath and wheezing.   Cardiovascular: Negative for chest pain, palpitations and leg swelling.  Gastrointestinal: Negative for abdominal distention, abdominal pain, constipation, diarrhea and nausea.  Genitourinary: Negative for difficulty urinating, dysuria, frequency and urgency.  Musculoskeletal: Positive  for arthralgias (typical arthritis). Negative for back pain, gait problem and myalgias.  Skin: Negative for color change, pallor, rash and wound.  Neurological: Negative for dizziness, tremors, syncope, speech difficulty, weakness, numbness and headaches.  Psychiatric/Behavioral: Negative for agitation and behavioral problems.  All other systems reviewed and are negative.    There is no immunization history on file for this patient. Pertinent  Health Maintenance Due  Topic Date Due  . DEXA SCAN  01/05/1988  . PNA vac Low Risk Adult (1 of 2 - PCV13) 01/05/1988  . INFLUENZA VACCINE   10/28/2015   No flowsheet data found. Functional Status Survey:    Vitals:   11/13/15 2309  BP: (!) 145/49  Pulse: 62  Resp: 20  Temp: 98 F (36.7 C)  SpO2: 96%  Weight: 136 lb 6.4 oz (61.9 kg)   Body mass index is 24.95 kg/m. Physical Exam  Constitutional: She is oriented to person, place, and time. Vital signs are normal. She appears well-developed and well-nourished. She is active and cooperative. She does not appear ill. No distress.  HENT:  Head: Normocephalic and atraumatic.  Mouth/Throat: Uvula is midline, oropharynx is clear and moist and mucous membranes are normal. Mucous membranes are not pale, not dry and not cyanotic.  Eyes: Conjunctivae, EOM and lids are normal. Pupils are equal, round, and reactive to light.  Neck: Trachea normal, normal range of motion and full passive range of motion without pain. Neck supple. No JVD present. No tracheal deviation, no edema and no erythema present. No thyromegaly present.  Cardiovascular: Normal rate, regular rhythm, intact distal pulses and normal pulses.  Exam reveals no gallop, no distant heart sounds and no friction rub.   No murmur heard. Pulmonary/Chest: Effort normal and breath sounds normal. No accessory muscle usage. No respiratory distress. She has no wheezes. She has no rales. She exhibits no tenderness.  Abdominal: Normal appearance and bowel sounds are normal. She exhibits no distension and no ascites. There is no tenderness.  Musculoskeletal: Normal range of motion. She exhibits no edema or tenderness.  Expected osteoarthritis, stiffness  Neurological: She is alert and oriented to person, place, and time. She has normal strength.  Skin: Skin is warm, dry and intact. No rash noted. She is not diaphoretic. No cyanosis or erythema. No pallor. Nails show no clubbing.  Single lumen PICC in RUE  Psychiatric: She has a normal mood and affect. Her speech is normal and behavior is normal. Judgment and thought content normal.  Cognition and memory are normal.  Nursing note and vitals reviewed.   Labs reviewed:  Recent Labs  09/14/15 1700  NA 139  K 4.7  CL 103  CO2 26  GLUCOSE 147*  BUN 23*  CREATININE 1.61*  CALCIUM 9.9    Recent Labs  09/14/15 1700  AST 27  ALT 17  ALKPHOS 49  BILITOT 0.7  PROT 6.6  ALBUMIN 4.1    Recent Labs  09/14/15 1700  WBC 12.6*  NEUTROABS 11.1*  HGB 12.0  HCT 35.8  MCV 94.7  PLT 248   No results found for: TSH No results found for: HGBA1C Lab Results  Component Value Date   CHOL 146 07/11/2014   HDL 52 07/11/2014   LDLCALC 87 07/11/2014   TRIG 35 07/11/2014    Significant Diagnostic Results in last 30 days:  No results found.  Assessment/Plan 1. Bacteremia due to group B Streptococcus Continue IV antibiotics. PICC line in place in [place]. Pt reports she is feeling well,  and wants to go home.   2. Benign essential HTN BP well controlled. Minimal fluctuations. No episodes of hypotension    Family/ staff Communication:   Total Time: 25 minutes  Documentation: 15 minutes  Face to Face: 10 minutes  Family/Phone:   Labs/tests ordered:  DNR  Medication list reviewed and assessed for continued appropriateness. Monthly medication orders reviewed and signed.  Vikki Ports, NP-C [facility] [phone], [place] [address] Cell Phone (Mon-Fri 8am-5pm):  [phone] On Call:  [phone] & follow prompts after 5pm & weekends Office Phone:  [phone] Office Fax:  [phone]

## 2015-11-14 LAB — URINALYSIS COMPLETE WITH MICROSCOPIC (ARMC ONLY)
BACTERIA UA: NONE SEEN
Bilirubin Urine: NEGATIVE
Glucose, UA: NEGATIVE mg/dL
HGB URINE DIPSTICK: NEGATIVE
Ketones, ur: NEGATIVE mg/dL
LEUKOCYTES UA: NEGATIVE
NITRITE: NEGATIVE
PH: 7 (ref 5.0–8.0)
PROTEIN: NEGATIVE mg/dL
SPECIFIC GRAVITY, URINE: 1.005 (ref 1.005–1.030)
Squamous Epithelial / LPF: NONE SEEN

## 2015-11-15 LAB — URINE CULTURE: Culture: <10000 — AB

## 2015-11-25 ENCOUNTER — Non-Acute Institutional Stay (SKILLED_NURSING_FACILITY): Payer: Medicare Other | Admitting: Gerontology

## 2015-11-25 DIAGNOSIS — B951 Streptococcus, group B, as the cause of diseases classified elsewhere: Secondary | ICD-10-CM

## 2015-11-25 DIAGNOSIS — R7881 Bacteremia: Secondary | ICD-10-CM

## 2015-11-25 NOTE — Progress Notes (Signed)
Location:      Place of Service:  SNF (31)  Provider: Toni Arthurs, NP-C  PCP: Rusty Aus, MD Patient Care Team: Rusty Aus, MD as PCP - General (Internal Medicine)  Extended Emergency Contact Information Primary Emergency Contact: Joycelyn Schmid Address: [address]          [place],  [address] [place] Phone: [phone] Relation: Daughter  Code Status: full Goals of care:  Advanced Directive information Advanced Directives 09/14/2015  Does patient have an advance directive? No     Allergies  Allergen Reactions  . Donepezil Nausea Only  . Vancomycin Hives    Chief Complaint  Patient presents with  . Discharge Note    HPI:  82 y.o. female seen today for discharge from the facility. She has a h/o Hypertension, which is well controlled on home regimen of antihypertensives. She is at the rehab facility to receive IV antibiotics for Bacteremia due to group B streptococcus. She had a single lumen PICC in the right upper arm, which was removed this morning. Dressing is CDI, no s/s of bleeding from site.Says she is feeling well and is looking forward to going home. Pain is well controlled and not having any dyspnea. VSS      Past Medical History:  Diagnosis Date  . Cancer [facility])    colon cancer  . Diverticulitis     Past Surgical History:  Procedure Laterality Date  . APPENDECTOMY    . CHOLECYSTECTOMY    . HIP SURGERY    . TONSILLECTOMY        reports that she has never smoked. She does not have any smokeless tobacco history on file. She reports that she does not drink alcohol. Her drug history is not on file. Social History   Social History  . Marital status: Widowed    Spouse name: N/A  . Number of children: N/A  . Years of education: N/A   Occupational History  . Not on file.   Social History Main Topics  . Smoking status: Never Smoker  . Smokeless tobacco: Not on file  . Alcohol use No  . Drug use: Unknown  .  Sexual activity: Not on file   Other Topics Concern  . Not on file   Social History Narrative  . No narrative on file   Functional Status Survey:    Allergies  Allergen Reactions  . Donepezil Nausea Only  . Vancomycin Hives    Pertinent  Health Maintenance Due  Topic Date Due  . DEXA SCAN  01/05/1988  . PNA vac Low Risk Adult (1 of 2 - PCV13) 01/05/1988  . INFLUENZA VACCINE  10/28/2015    Medications:   Medication List       Accurate as of 11/25/15 10:23 PM. Always use your most recent med list.          acetaminophen 500 MG tablet Commonly known as:  TYLENOL Take by mouth.   amLODipine 5 MG tablet Commonly known as:  NORVASC Take 10 mg by mouth.   aspirin EC 81 MG tablet Take by mouth.   Biotin 1000 MCG tablet Take by mouth.   CALCIUM 500/D 500-200 MG-UNIT tablet Generic drug:  calcium-vitamin D Take by mouth.   denosumab 60 MG/ML Soln injection Commonly known as:  PROLIA Inject 60 mg into the skin.   docusate sodium 100 MG capsule Commonly known as:  COLACE Take by mouth.   gabapentin 300 MG capsule Commonly known as:  NEURONTIN Take by mouth.   Ginkgo Biloba 40 MG Tabs Take by mouth.   lisinopril 10 MG tablet Commonly known as:  PRINIVIL,ZESTRIL TAKE 1 TABLET ONE TIME DAILY   MULTI-VITAMINS Tabs Take by mouth.   polyethylene glycol packet Commonly known as:  MIRALAX / GLYCOLAX Take by mouth.   propranolol 20 MG tablet Commonly known as:  INDERAL Take by mouth.   traMADol 50 MG tablet Commonly known as:  ULTRAM   VITAMIN D-1000 MAX ST 1000 units tablet Generic drug:  Cholecalciferol Take by mouth.   Vitamin-B Complex Tabs Take by mouth.       Review of Systems  Vitals:   11/25/15 0600  BP: (!) 154/71  Pulse: 75  Resp: 20  Temp: 97.9 F (36.6 C)  SpO2: 97%  Weight: 132 lb 3.2 oz (60 kg)   Body mass index is 24.18 kg/m. Physical Exam  Labs reviewed: Basic Metabolic Panel:  Recent Labs  09/14/15 1700    NA 139  K 4.7  CL 103  CO2 26  GLUCOSE 147*  BUN 23*  CREATININE 1.61*  CALCIUM 9.9   Liver Function Tests:  Recent Labs  09/14/15 1700  AST 27  ALT 17  ALKPHOS 49  BILITOT 0.7  PROT 6.6  ALBUMIN 4.1   No results for input(s): LIPASE, AMYLASE in the last 8760 hours. No results for input(s): AMMONIA in the last 8760 hours. CBC:  Recent Labs  09/14/15 1700  WBC 12.6*  NEUTROABS 11.1*  HGB 12.0  HCT 35.8  MCV 94.7  PLT 248   Cardiac Enzymes:  Recent Labs  09/14/15 1700  TROPONINI <0.03   BNP: Invalid input(s): POCBNP CBG: No results for input(s): GLUCAP in the last 8760 hours.  Procedures and Imaging Studies During Stay: No results found.  Assessment/Plan:   1. Bacteremia due to group B Streptococcus Follow up with PCP as soon as possible. Continue PT exercises at home as instructed.   2. Benign essential HTN BP well controlled. Minimal fluctuations. No episodes of hypotension   Patient is being discharged with the following home health services:  none  Patient is being discharged with the following durable medical equipment:  none  Patient has been advised to f/u with their PCP in 1-2 weeks to bring them up to date on their rehab stay.  Social services at facility was responsible for arranging this appointment.  Pt was provided with a 30 day supply of prescriptions for medications and refills must be obtained from their PCP.  For controlled substances, a more limited supply may be provided adequate until PCP appointment only.  Future labs/tests needed:  Per pcp  Family/ staff Communication:   Total Time:  Documentation:  Face to Face:  Family/Phone:  Vikki Ports, NP-C [facility]iatric[facility] [address], [place] [address] Cell Phone (Mon-Fri 8am-5pm):  [phone] On Call:  [phone] & follow prompts after 5pm & weekends Office Phone:  [phone] Office Fax:  [phone]

## 2015-11-26 ENCOUNTER — Encounter: Payer: Self-pay | Admitting: Podiatry

## 2015-11-26 ENCOUNTER — Ambulatory Visit (INDEPENDENT_AMBULATORY_CARE_PROVIDER_SITE_OTHER): Payer: Medicare Other | Admitting: Podiatry

## 2015-11-26 DIAGNOSIS — M79676 Pain in unspecified toe(s): Secondary | ICD-10-CM | POA: Diagnosis not present

## 2015-11-26 DIAGNOSIS — B351 Tinea unguium: Secondary | ICD-10-CM

## 2015-11-26 NOTE — Progress Notes (Signed)
She presents today with chief complaint of painful elongated toenails.  Objective: Vital signs are stable she is alert and 3. Pulses are palpable. Her toenails are long thick yellow dystrophic with mycotic painful palpation with sharp incurvated nail margins.  Assessment: Pain in limb secondary onychomycosis.  Plan: Debridement of toenails 1 through 5 bilateral. All up with her in 3 months.

## 2015-12-09 ENCOUNTER — Other Ambulatory Visit: Payer: Self-pay | Admitting: Internal Medicine

## 2015-12-09 ENCOUNTER — Ambulatory Visit
Admission: RE | Admit: 2015-12-09 | Discharge: 2015-12-09 | Disposition: A | Discharge status: Home / Self Care | Payer: Medicare Other | Source: Ambulatory Visit | Attending: Internal Medicine | Admitting: Internal Medicine

## 2015-12-09 DIAGNOSIS — N281 Cyst of kidney, acquired: Secondary | ICD-10-CM | POA: Insufficient documentation

## 2015-12-09 DIAGNOSIS — S32000A Wedge compression fracture of unspecified lumbar vertebra, initial encounter for closed fracture: Secondary | ICD-10-CM | POA: Insufficient documentation

## 2015-12-09 DIAGNOSIS — M8580 Other specified disorders of bone density and structure, unspecified site: Secondary | ICD-10-CM | POA: Diagnosis not present

## 2015-12-09 DIAGNOSIS — M47819 Spondylosis without myelopathy or radiculopathy, site unspecified: Secondary | ICD-10-CM | POA: Insufficient documentation

## 2015-12-09 DIAGNOSIS — M4854XA Collapsed vertebra, not elsewhere classified, thoracic region, initial encounter for fracture: Secondary | ICD-10-CM | POA: Diagnosis not present

## 2015-12-09 DIAGNOSIS — K7689 Other specified diseases of liver: Secondary | ICD-10-CM | POA: Insufficient documentation

## 2015-12-15 ENCOUNTER — Encounter
Admission: RE | Admit: 2015-12-15 | Discharge: 2015-12-15 | Disposition: A | Discharge status: Home / Self Care | Payer: Medicare Other | Source: Ambulatory Visit | Attending: Orthopedic Surgery | Admitting: Orthopedic Surgery

## 2015-12-15 DIAGNOSIS — M1990 Unspecified osteoarthritis, unspecified site: Secondary | ICD-10-CM | POA: Diagnosis not present

## 2015-12-15 DIAGNOSIS — E785 Hyperlipidemia, unspecified: Secondary | ICD-10-CM | POA: Diagnosis not present

## 2015-12-15 DIAGNOSIS — S32010A Wedge compression fracture of first lumbar vertebra, initial encounter for closed fracture: Secondary | ICD-10-CM | POA: Diagnosis present

## 2015-12-15 DIAGNOSIS — Z806 Family history of leukemia: Secondary | ICD-10-CM | POA: Diagnosis not present

## 2015-12-15 DIAGNOSIS — Z8249 Family history of ischemic heart disease and other diseases of the circulatory system: Secondary | ICD-10-CM | POA: Diagnosis not present

## 2015-12-15 DIAGNOSIS — I129 Hypertensive chronic kidney disease with stage 1 through stage 4 chronic kidney disease, or unspecified chronic kidney disease: Secondary | ICD-10-CM | POA: Diagnosis not present

## 2015-12-15 DIAGNOSIS — Z9849 Cataract extraction status, unspecified eye: Secondary | ICD-10-CM | POA: Diagnosis not present

## 2015-12-15 DIAGNOSIS — M810 Age-related osteoporosis without current pathological fracture: Secondary | ICD-10-CM | POA: Diagnosis not present

## 2015-12-15 DIAGNOSIS — G250 Essential tremor: Secondary | ICD-10-CM | POA: Diagnosis not present

## 2015-12-15 DIAGNOSIS — Z820 Family history of epilepsy and other diseases of the nervous system: Secondary | ICD-10-CM | POA: Diagnosis not present

## 2015-12-15 DIAGNOSIS — M792 Neuralgia and neuritis, unspecified: Secondary | ICD-10-CM | POA: Diagnosis not present

## 2015-12-15 DIAGNOSIS — Z980 Intestinal bypass and anastomosis status: Secondary | ICD-10-CM | POA: Diagnosis not present

## 2015-12-15 DIAGNOSIS — E042 Nontoxic multinodular goiter: Secondary | ICD-10-CM | POA: Diagnosis not present

## 2015-12-15 DIAGNOSIS — Z9049 Acquired absence of other specified parts of digestive tract: Secondary | ICD-10-CM | POA: Diagnosis not present

## 2015-12-15 DIAGNOSIS — Z808 Family history of malignant neoplasm of other organs or systems: Secondary | ICD-10-CM | POA: Diagnosis not present

## 2015-12-15 DIAGNOSIS — Z87442 Personal history of urinary calculi: Secondary | ICD-10-CM | POA: Diagnosis not present

## 2015-12-15 DIAGNOSIS — Z800 Family history of malignant neoplasm of digestive organs: Secondary | ICD-10-CM | POA: Diagnosis not present

## 2015-12-15 DIAGNOSIS — Z853 Personal history of malignant neoplasm of breast: Secondary | ICD-10-CM | POA: Diagnosis not present

## 2015-12-15 DIAGNOSIS — Z85038 Personal history of other malignant neoplasm of large intestine: Secondary | ICD-10-CM | POA: Diagnosis not present

## 2015-12-15 DIAGNOSIS — S22088A Other fracture of T11-T12 vertebra, initial encounter for closed fracture: Secondary | ICD-10-CM | POA: Diagnosis not present

## 2015-12-15 DIAGNOSIS — W1839XA Other fall on same level, initial encounter: Secondary | ICD-10-CM | POA: Diagnosis not present

## 2015-12-15 DIAGNOSIS — N183 Chronic kidney disease, stage 3 (moderate): Secondary | ICD-10-CM | POA: Diagnosis not present

## 2015-12-15 HISTORY — DX: Unspecified fracture of unspecified toe(s), initial encounter for closed fracture: S92.919A

## 2015-12-15 HISTORY — DX: Hyperlipidemia, unspecified: E78.5

## 2015-12-15 HISTORY — DX: Calculus of kidney: N20.0

## 2015-12-15 HISTORY — DX: Essential (primary) hypertension: I10

## 2015-12-15 HISTORY — DX: Age-related osteoporosis without current pathological fracture: M81.0

## 2015-12-15 HISTORY — DX: Chronic kidney disease, stage 3 (moderate): N18.3

## 2015-12-15 HISTORY — DX: Spinal stenosis, cervical region: M48.02

## 2015-12-15 HISTORY — DX: Chronic kidney disease, stage 3 unspecified: N18.30

## 2015-12-15 HISTORY — DX: Cellulitis, unspecified: L03.90

## 2015-12-15 HISTORY — DX: Essential tremor: G25.0

## 2015-12-15 HISTORY — DX: Unspecified osteoarthritis, unspecified site: M19.90

## 2015-12-15 HISTORY — DX: Nontoxic multinodular goiter: E04.2

## 2015-12-15 NOTE — Pre-Procedure Instructions (Signed)
ANESTHESIA - ED EKG INTERPRETATION NOTED IN NOTE DATED 09/14/15  EKG: Interpreted by me. Sinus rhythm rate 82 bpm, baseline artifact, left axis deviation, LVH

## 2015-12-15 NOTE — Patient Instructions (Signed)
Your procedure is scheduled on: 12/16/15 Report to Day Surgery. At 1:30 To find out your arrival time please call [phone].  Remember: Instructions that are not followed completely may result in serious medical risk, up to and including death, or upon the discretion of your surgeon and anesthesiologist your surgery may need to be rescheduled.    __X__ 1. Do not eat food or drink liquids after midnight. No gum chewing or hard candies.     __X__ 2. No Alcohol for 24 hours before or after surgery.   ____ 3. Bring all medications with you on the day of surgery if instructed.    __X__ 4. Notify your doctor if there is any change in your medical condition     (cold, fever, infections).     Do not wear jewelry, make-up, hairpins, clips or nail polish.  Do not wear lotions, powders, or perfumes.   Do not shave 48 hours prior to surgery. Men may shave face and neck.  Do not bring valuables to the hospital.    [facility] is not responsible for any belongings or valuables.               Contacts, dentures or bridgework may not be worn into surgery.  Leave your suitcase in the car. After surgery it may be brought to your room.  For patients admitted to the hospital, discharge time is determined by your                treatment team.   Patients discharged the day of surgery will not be allowed to drive home.   Please read over the following fact sheets that you were given:   Surgical Site Infection Prevention   __x__ Take these medicines the morning of surgery with A SIP OF WATER:    1. amlodipine  2. gabapentin  3. propranolol  4.  5.  6.  ____ Fleet Enema (as directed)   __x__ Use CHG Soap as directed  ____ Use inhalers on the day of surgery  ____ Stop metformin 2 days prior to surgery    ____ Take 1/2 of usual insulin dose the night before surgery and none on the morning of surgery.   ____ Stop Coumadin/Plavix/aspirin on No more aspirin until after procedure  ____ Stop  Anti-inflammatories on    ____ Stop supplements until after surgery.    ____ Bring C-Pap to the hospital.

## 2015-12-15 NOTE — OR Nursing (Signed)
Echocardiogram W Colorflow Spectral Doppler7/28/2017 [facility] Component Name Value Ref Range  LV Diastolic Diameter PLAX 4.4 cm  LV Systolic Diameter PLAX 123XX123 cm  IVS Diastolic Thickness 99991111 cm  LVPW Diastolic Thickness A999333 cm  Aortic Root Diameter XX123456 cm  LA Systolic Diameter LX 0000000 cm  LA Area 4C View 16.60 cm2  LA Area 2C View 17.50 cm2  AV Peak Velocity 1.29 m/s  AV Peak Gradient 7.00   Mitral E Point Velocity 0.01 m/s  Mitral A Point Velocity 0.01 m/s  Mitral E to A Ratio 0.50   PV Peak Velocity 0.81 m/s  PV peak gradient 3.00 mmHg   Result Narrative   Normal left ventricular systolic function, ejection fraction 55 to 60%  Aortic sclerosis  Normal right ventricular systolic function  Elevated right atrial pressure

## 2015-12-16 ENCOUNTER — Encounter
Admission: RE | Disposition: A | Discharge status: Home / Self Care | Payer: Self-pay | Source: Ambulatory Visit | Attending: Orthopedic Surgery

## 2015-12-16 ENCOUNTER — Ambulatory Visit
Admission: RE | Admit: 2015-12-16 | Discharge: 2015-12-16 | Disposition: A | Discharge status: Home / Self Care | Payer: Medicare Other | Source: Ambulatory Visit | Attending: Orthopedic Surgery | Admitting: Orthopedic Surgery

## 2015-12-16 ENCOUNTER — Encounter: Payer: Self-pay | Admitting: *Deleted

## 2015-12-16 ENCOUNTER — Ambulatory Visit: Payer: Medicare Other | Admitting: Anesthesiology

## 2015-12-16 ENCOUNTER — Ambulatory Visit: Payer: Medicare Other

## 2015-12-16 DIAGNOSIS — Z980 Intestinal bypass and anastomosis status: Secondary | ICD-10-CM | POA: Insufficient documentation

## 2015-12-16 DIAGNOSIS — Z9849 Cataract extraction status, unspecified eye: Secondary | ICD-10-CM | POA: Insufficient documentation

## 2015-12-16 DIAGNOSIS — W1839XA Other fall on same level, initial encounter: Secondary | ICD-10-CM | POA: Insufficient documentation

## 2015-12-16 DIAGNOSIS — Z85038 Personal history of other malignant neoplasm of large intestine: Secondary | ICD-10-CM | POA: Insufficient documentation

## 2015-12-16 DIAGNOSIS — M810 Age-related osteoporosis without current pathological fracture: Secondary | ICD-10-CM | POA: Insufficient documentation

## 2015-12-16 DIAGNOSIS — S22088A Other fracture of T11-T12 vertebra, initial encounter for closed fracture: Secondary | ICD-10-CM | POA: Insufficient documentation

## 2015-12-16 DIAGNOSIS — G250 Essential tremor: Secondary | ICD-10-CM | POA: Insufficient documentation

## 2015-12-16 DIAGNOSIS — Z853 Personal history of malignant neoplasm of breast: Secondary | ICD-10-CM | POA: Insufficient documentation

## 2015-12-16 DIAGNOSIS — M1990 Unspecified osteoarthritis, unspecified site: Secondary | ICD-10-CM | POA: Insufficient documentation

## 2015-12-16 DIAGNOSIS — Z808 Family history of malignant neoplasm of other organs or systems: Secondary | ICD-10-CM | POA: Insufficient documentation

## 2015-12-16 DIAGNOSIS — E042 Nontoxic multinodular goiter: Secondary | ICD-10-CM | POA: Insufficient documentation

## 2015-12-16 DIAGNOSIS — Z419 Encounter for procedure for purposes other than remedying health state, unspecified: Secondary | ICD-10-CM

## 2015-12-16 DIAGNOSIS — E785 Hyperlipidemia, unspecified: Secondary | ICD-10-CM | POA: Insufficient documentation

## 2015-12-16 DIAGNOSIS — Z800 Family history of malignant neoplasm of digestive organs: Secondary | ICD-10-CM | POA: Insufficient documentation

## 2015-12-16 DIAGNOSIS — Z87442 Personal history of urinary calculi: Secondary | ICD-10-CM | POA: Insufficient documentation

## 2015-12-16 DIAGNOSIS — Z8249 Family history of ischemic heart disease and other diseases of the circulatory system: Secondary | ICD-10-CM | POA: Insufficient documentation

## 2015-12-16 DIAGNOSIS — S32010A Wedge compression fracture of first lumbar vertebra, initial encounter for closed fracture: Secondary | ICD-10-CM | POA: Insufficient documentation

## 2015-12-16 DIAGNOSIS — M792 Neuralgia and neuritis, unspecified: Secondary | ICD-10-CM | POA: Insufficient documentation

## 2015-12-16 DIAGNOSIS — I129 Hypertensive chronic kidney disease with stage 1 through stage 4 chronic kidney disease, or unspecified chronic kidney disease: Secondary | ICD-10-CM | POA: Insufficient documentation

## 2015-12-16 DIAGNOSIS — N183 Chronic kidney disease, stage 3 (moderate): Secondary | ICD-10-CM | POA: Insufficient documentation

## 2015-12-16 DIAGNOSIS — Z820 Family history of epilepsy and other diseases of the nervous system: Secondary | ICD-10-CM | POA: Insufficient documentation

## 2015-12-16 DIAGNOSIS — Z806 Family history of leukemia: Secondary | ICD-10-CM | POA: Insufficient documentation

## 2015-12-16 DIAGNOSIS — Z9049 Acquired absence of other specified parts of digestive tract: Secondary | ICD-10-CM | POA: Insufficient documentation

## 2015-12-16 HISTORY — PX: KYPHOPLASTY: SHX5884

## 2015-12-16 SURGERY — KYPHOPLASTY
Anesthesia: General | Wound class: Clean

## 2015-12-16 MED ORDER — LACTATED RINGERS IV SOLN: INTRAVENOUS | Status: DC

## 2015-12-16 MED ORDER — OXYCODONE HCL 5 MG/5ML PO SOLN: 5.0000 mg | Freq: Once | ORAL | Status: DC | PRN

## 2015-12-16 MED ORDER — BUPIVACAINE-EPINEPHRINE (PF) 0.5% -1:200000 IJ SOLN
INTRAMUSCULAR | Status: DC | PRN
  Administered 2015-12-16: 20 mL via PERINEURAL

## 2015-12-16 MED ORDER — BUPIVACAINE-EPINEPHRINE (PF) 0.5% -1:200000 IJ SOLN
INTRAMUSCULAR | Status: AC
  Filled 2015-12-16: qty 30

## 2015-12-16 MED ORDER — LIDOCAINE HCL (CARDIAC) 20 MG/ML IV SOLN
INTRAVENOUS | Status: DC | PRN
  Administered 2015-12-16: 25 mg via INTRAVENOUS

## 2015-12-16 MED ORDER — SUGAMMADEX SODIUM 200 MG/2ML IV SOLN
INTRAVENOUS | Status: DC | PRN
  Administered 2015-12-16: 130 mg via INTRAVENOUS

## 2015-12-16 MED ORDER — ONDANSETRON HCL 4 MG/2ML IJ SOLN
INTRAMUSCULAR | Status: DC | PRN
  Administered 2015-12-16: 4 mg via INTRAVENOUS

## 2015-12-16 MED ORDER — SODIUM CHLORIDE 0.9 % IV SOLN
INTRAVENOUS | Status: DC
  Administered 2015-12-16: 100 mL/h via INTRAVENOUS

## 2015-12-16 MED ORDER — DEXAMETHASONE SODIUM PHOSPHATE 10 MG/ML IJ SOLN
INTRAMUSCULAR | Status: DC | PRN
  Administered 2015-12-16: 5 mg via INTRAVENOUS

## 2015-12-16 MED ORDER — CEFAZOLIN SODIUM-DEXTROSE 2-4 GM/100ML-% IV SOLN: 2.0000 g | Freq: Once | INTRAVENOUS | Status: DC

## 2015-12-16 MED ORDER — HYDROCODONE-ACETAMINOPHEN 5-325 MG PO TABS: 1.0000 | ORAL_TABLET | ORAL | Status: DC | PRN

## 2015-12-16 MED ORDER — FENTANYL CITRATE (PF) 100 MCG/2ML IJ SOLN: 25.0000 ug | INTRAMUSCULAR | Status: DC | PRN

## 2015-12-16 MED ORDER — PROMETHAZINE HCL 25 MG/ML IJ SOLN: 6.2500 mg | INTRAMUSCULAR | Status: DC | PRN

## 2015-12-16 MED ORDER — EPHEDRINE SULFATE 50 MG/ML IJ SOLN
INTRAMUSCULAR | Status: DC | PRN
  Administered 2015-12-16: 10 mg via INTRAVENOUS

## 2015-12-16 MED ORDER — IOPAMIDOL (ISOVUE-M 200) INJECTION 41%
INTRAMUSCULAR | Status: AC
  Filled 2015-12-16: qty 20

## 2015-12-16 MED ORDER — PROPOFOL 10 MG/ML IV BOLUS
INTRAVENOUS | Status: DC | PRN
  Administered 2015-12-16: 80 mg via INTRAVENOUS

## 2015-12-16 MED ORDER — OXYCODONE HCL 5 MG PO TABS: 5.0000 mg | ORAL_TABLET | Freq: Once | ORAL | Status: DC | PRN

## 2015-12-16 MED ORDER — HYDROCODONE-ACETAMINOPHEN 5-325 MG PO TABS: 1.0000 | ORAL_TABLET | Freq: Four times a day (QID) | ORAL | 0 refills | Status: DC | PRN

## 2015-12-16 MED ORDER — METOCLOPRAMIDE HCL 10 MG PO TABS: 5.0000 mg | ORAL_TABLET | Freq: Three times a day (TID) | ORAL | Status: DC | PRN

## 2015-12-16 MED ORDER — ROCURONIUM BROMIDE 100 MG/10ML IV SOLN
INTRAVENOUS | Status: DC | PRN
  Administered 2015-12-16 (×2): 5 mg via INTRAVENOUS

## 2015-12-16 MED ORDER — METOCLOPRAMIDE HCL 5 MG/ML IJ SOLN: 5.0000 mg | Freq: Three times a day (TID) | INTRAMUSCULAR | Status: DC | PRN

## 2015-12-16 MED ORDER — CEFAZOLIN SODIUM-DEXTROSE 2-4 GM/100ML-% IV SOLN
INTRAVENOUS | Status: AC
  Filled 2015-12-16: qty 100

## 2015-12-16 MED ORDER — LIDOCAINE HCL (PF) 1 % IJ SOLN
INTRAMUSCULAR | Status: AC
  Filled 2015-12-16: qty 60

## 2015-12-16 MED ORDER — FAMOTIDINE 20 MG PO TABS
20.0000 mg | ORAL_TABLET | Freq: Once | ORAL | Status: AC
  Administered 2015-12-16: 20 mg via ORAL

## 2015-12-16 MED ORDER — SUCCINYLCHOLINE CHLORIDE 20 MG/ML IJ SOLN
INTRAMUSCULAR | Status: DC | PRN
  Administered 2015-12-16 (×2): 100 mg via INTRAVENOUS

## 2015-12-16 MED ORDER — SODIUM CHLORIDE 0.9 % IV SOLN: INTRAVENOUS | Status: DC

## 2015-12-16 MED ORDER — ONDANSETRON HCL 4 MG/2ML IJ SOLN: 4.0000 mg | Freq: Four times a day (QID) | INTRAMUSCULAR | Status: DC | PRN

## 2015-12-16 MED ORDER — ONDANSETRON HCL 4 MG PO TABS: 4.0000 mg | ORAL_TABLET | Freq: Four times a day (QID) | ORAL | Status: DC | PRN

## 2015-12-16 MED ORDER — FAMOTIDINE 20 MG PO TABS
ORAL_TABLET | ORAL | Status: AC
Start: 2015-12-16 — End: 2015-12-16
  Administered 2015-12-16: 20 mg via ORAL
  Filled 2015-12-16: qty 1

## 2015-12-16 MED ORDER — LIDOCAINE HCL 1 % IJ SOLN
INTRAMUSCULAR | Status: DC | PRN
  Administered 2015-12-16: 20 mL
  Administered 2015-12-16: 10 mL

## 2015-12-16 MED ORDER — FENTANYL CITRATE (PF) 100 MCG/2ML IJ SOLN
INTRAMUSCULAR | Status: DC | PRN
  Administered 2015-12-16: 50 ug via INTRAVENOUS

## 2015-12-16 SURGICAL SUPPLY — 17 items
CEMENT BONE KYPHON CDS (Cement) ×3 IMPLANT
DEVICE BIOPSY BONE KYPHX (INSTRUMENTS) ×3 IMPLANT
DRAPE C-ARM XRAY 36X54 (DRAPES) ×3 IMPLANT
DURAPREP 26ML APPLICATOR (WOUND CARE) ×3 IMPLANT
GLOVE BIOGEL PI IND STRL 7.5 (GLOVE) ×1 IMPLANT
GLOVE BIOGEL PI INDICATOR 7.5 (GLOVE) ×2
GLOVE SURG SYN 9.0  PF PI (GLOVE) ×4
GLOVE SURG SYN 9.0 PF PI (GLOVE) ×2 IMPLANT
GOWN SRG 2XL LVL 4 RGLN SLV (GOWNS) ×1 IMPLANT
GOWN STRL NON-REIN 2XL LVL4 (GOWNS) ×2
GOWN STRL REUS W/ TWL LRG LVL3 (GOWN DISPOSABLE) ×1 IMPLANT
GOWN STRL REUS W/TWL LRG LVL3 (GOWN DISPOSABLE) ×2
LIQUID BAND (GAUZE/BANDAGES/DRESSINGS) ×3 IMPLANT
PACK KYPHOPLASTY (MISCELLANEOUS) ×3 IMPLANT
STRAP SAFETY BODY (MISCELLANEOUS) ×3 IMPLANT
TRAY KYPHOPAK 15/3 EXPRESS 1ST (MISCELLANEOUS) ×3 IMPLANT
TRAY KYPHOPAK 20/3 EXPRESS 1ST (MISCELLANEOUS) IMPLANT

## 2015-12-16 NOTE — Anesthesia Preprocedure Evaluation (Signed)
Anesthesia Evaluation  Patient identified by MRN, date of birth, ID band Patient awake    Reviewed: Allergy & Precautions, NPO status , Patient's Chart, lab work & pertinent test results, reviewed documented beta blocker date and time   History of Anesthesia Complications Negative for: history of anesthetic complications  Airway Mallampati: III  TM Distance: >3 FB Neck ROM: Full    Dental  (+) Poor Dentition   Pulmonary neg pulmonary ROS, neg sleep apnea, neg COPD,    breath sounds clear to auscultation- rhonchi (-) wheezing      Cardiovascular hypertension, Pt. on medications and Pt. on home beta blockers (-) CAD and (-) Past MI  Rhythm:Regular Rate:Normal - Systolic murmurs and - Diastolic murmurs    Neuro/Psych negative neurological ROS  negative psych ROS   GI/Hepatic negative GI ROS, Neg liver ROS,   Endo/Other  negative endocrine ROSneg diabetes  Renal/GU CRFRenal disease     Musculoskeletal  (+) Arthritis , Osteoarthritis,    Abdominal (+) - obese,   Peds  Hematology negative hematology ROS (+)   Anesthesia Other Findings Past Medical History: No date: Arthritis No date: Benign essential tremor No date: Calculus of kidney No date: Cancer ([place])     Comment: colon cancer No date: Cancer ([place])     Comment: breast cancer (right) No date: Cellulitis     Comment: right breast No date: Cervical spinal stenosis No date: CKD (chronic kidney disease), stage III No date: Diverticulitis No date: HLD (hyperlipidemia) No date: Hypertension No date: Multinodular goiter No date: Osteoporosis No date: Toe fracture   Reproductive/Obstetrics                           Anesthesia Physical Anesthesia Plan  ASA: III  Anesthesia Plan: General   Post-op Pain Management:    Induction: Intravenous  Airway Management Planned: Oral ETT  Additional Equipment:   Intra-op Plan:    Post-operative Plan: Extubation in OR  Informed Consent: I have reviewed the patients History and Physical, chart, labs and discussed the procedure including the risks, benefits and alternatives for the proposed anesthesia with the patient or authorized representative who has indicated his/her understanding and acceptance.   Dental advisory given  Plan Discussed with: CRNA and Anesthesiologist  Anesthesia Plan Comments:         Anesthesia Quick Evaluation

## 2015-12-16 NOTE — Discharge Instructions (Addendum)
AMBULATORY SURGERY  DISCHARGE INSTRUCTIONS   1) The drugs that you were given will stay in your system until tomorrow so for the next 24 hours you should not:  A) Drive an automobile B) Make any legal decisions C) Drink any alcoholic beverage   2) You may resume regular meals tomorrow.  Today it is better to start with liquids and gradually work up to solid foods.  You may eat anything you prefer, but it is better to start with liquids, then soup and crackers, and gradually work up to solid foods.   3) Please notify your doctor immediately if you have any unusual bleeding, trouble breathing, redness and pain at the surgery site, drainage, fever, or pain not relieved by medication.  4) Your post-operative visit with Dr.                                     is: Date:                        Time:    Please call to schedule your post-operative visit.  5) Additional Instructions: Keep bandage on until Thursday and then can remove Band-Aid's and shower. Minimal activities today activities as tolerated tomorrow. Pain medicine as directed. Should have follow-up appointment with me in 2 weeks for follow-up x-ray and wound check

## 2015-12-16 NOTE — Anesthesia Postprocedure Evaluation (Signed)
Anesthesia Post Note  Patient: [name]  Procedure(s) Performed: Procedure(s) (LRB): KYPHOPLASTY (N/A)  Patient location during evaluation: PACU Anesthesia Type: General Level of consciousness: awake and alert Pain management: pain level controlled Vital Signs Assessment: post-procedure vital signs reviewed and stable Respiratory status: spontaneous breathing, nonlabored ventilation and respiratory function stable Cardiovascular status: blood pressure returned to baseline and stable Postop Assessment: no signs of nausea or vomiting Anesthetic complications: no    Last Vitals:  Vitals:   12/16/15 1620 12/16/15 1632  BP: (!) 157/76 (!) 150/63  Pulse: (!) 53 (!) 52  Resp: (!) 25 (!) 23  Temp: 36.5 C     Last Pain:  Vitals:   12/16/15 1620  TempSrc:   PainSc: 0-No pain                 Sherilyn Windhorst

## 2015-12-16 NOTE — Op Note (Signed)
12/16/2015  3:36 PM  PATIENT:  [name]  82 y.o. female  PRE-OPERATIVE DIAGNOSIS:  THORACIC 12 [place] OF FIRST LUMBAR VERTEBRA  POST-OPERATIVE DIAGNOSIS:  THORACIC 12 COMPRESSION FRACTURE,WEDGE COMPRESSION FRACTURE OF FIRST LUMBAR VERTEBRA  PROCEDURE:  Procedure(s): KYPHOPLASTY (N/A) T12 and L1  SURGEON: Laurene Footman, MD  ASSISTANTS: None  ANESTHESIA:   general  EBL:  Total I/O In: 500 [I.V.:500] Out: 5 [Blood:5]  BLOOD ADMINISTERED:none  DRAINS: none   LOCAL MEDICATIONS USED:  MARCAINE    and XYLOCAINE   SPECIMEN:  Source of Specimen:  T12 vertebral body biopsy  DISPOSITION OF SPECIMEN:  PATHOLOGY  COUNTS:  YES  TOURNIQUET:  * No tourniquets in log *  IMPLANTS: Bone cement  DICTATION: .Dragon Dictation patient brought the operating room and after adequate general anesthesia was obtained she was placed in a prone position. C-arm was brought in and good visualization of the affected levels was obtained. Appropriate patient identification and timeout procedures were completed and local anesthetic was infiltrated near the planned incisions after prepping the skin. Back was prepped and draped in sterile fashion and repeat timeout procedure carried out. Spinal needle was used to get down to the affected pedicles on the right at T12 and the left at L1 because of a size of pedicles and local anesthetic a 50-50 mix of 1% Xylocaine have percent Sensorcaine with epinephrine total 20 cc at each level was injected. Small incision was made at each level and the trocar advanced in a extrapedicular fashion and the vertebral body. Care was taken to stay outside the neural foramen and the spinal canal with the use of the AP and lateral imaging. Biopsy was obtained at T12 there is no specimen at obtained at L1. Drilling was carried out and then inflation of the balloon at each level. Following this the bone cement was a appropriate consistency  both levels were filled with approximately 2 and after 3 cc of bone cement and this gave good fill of both levels. After the cemented set trochars removed and permanent C-arm views were obtained. Dermabond was used for the skin closure followed by Band-Aids  PLAN OF CARE: Discharge to home after PACU  PATIENT DISPOSITION:  PACU - hemodynamically stable.

## 2015-12-16 NOTE — Transfer of Care (Signed)
Immediate Anesthesia Transfer of Care Note  Patient: [name]  Procedure(s) Performed: Procedure(s): KYPHOPLASTY (N/A)  Patient Location: PACU  Anesthesia Type:General  Level of Consciousness: sedated  Airway & Oxygen Therapy: Patient Spontanous Breathing and Patient connected to face mask oxygen  Post-op Assessment: Report given to RN and Post -op Vital signs reviewed and stable  Post vital signs: Reviewed and stable  Last Vitals:  Vitals:   12/16/15 1338 12/16/15 1550  BP: (!) 162/58 (!) 175/73  Pulse: (!) 56 (!) 52  Resp: 18 16  Temp: 36.6 C (97.3F    Last Pain:  Vitals:   12/16/15 1338  TempSrc: Oral  PainSc: 4       Patients Stated Pain Goal: 0 ([id number])  Complications: No apparent anesthesia complications

## 2015-12-16 NOTE — Anesthesia Procedure Notes (Signed)
Procedure Name: Intubation Date/Time: 12/16/2015 2:36 PM Performed by: Dionne Bucy Pre-anesthesia Checklist: Patient identified, Patient being monitored, Timeout performed, Emergency Drugs available and Suction available Patient Re-evaluated:Patient Re-evaluated prior to inductionOxygen Delivery Method: Circle system utilized Preoxygenation: Pre-oxygenation with 100% oxygen Intubation Type: IV induction Ventilation: Mask ventilation without difficulty Laryngoscope Size: Mac and 3 Grade View: Grade I Tube type: Oral Tube size: 7.0 mm Number of attempts: 1 Airway Equipment and Method: Stylet Placement Confirmation: ETT inserted through vocal cords under direct vision,  positive ETCO2 and breath sounds checked- equal and bilateral Secured at: 21 cm Tube secured with: Tape Dental Injury: Teeth and Oropharynx as per pre-operative assessment

## 2015-12-16 NOTE — H&P (Signed)
Reviewed paper H+P, will be scanned into chart. No changes noted.  

## 2015-12-18 LAB — SURGICAL PATHOLOGY

## 2015-12-19 ENCOUNTER — Encounter: Payer: Self-pay | Admitting: Orthopedic Surgery

## 2016-01-25 ENCOUNTER — Encounter: Payer: Self-pay | Admitting: Emergency Medicine

## 2016-01-25 ENCOUNTER — Inpatient Hospital Stay
Admission: EM | Admit: 2016-01-25 | Discharge: 2016-01-28 | DRG: 064 | Disposition: E | Payer: Medicare Other | Attending: Internal Medicine | Admitting: Internal Medicine

## 2016-01-25 ENCOUNTER — Emergency Department: Payer: Medicare Other

## 2016-01-25 DIAGNOSIS — Z79899 Other long term (current) drug therapy: Secondary | ICD-10-CM | POA: Diagnosis not present

## 2016-01-25 DIAGNOSIS — Z888 Allergy status to other drugs, medicaments and biological substances status: Secondary | ICD-10-CM

## 2016-01-25 DIAGNOSIS — N183 Chronic kidney disease, stage 3 (moderate): Secondary | ICD-10-CM | POA: Diagnosis present

## 2016-01-25 DIAGNOSIS — G936 Cerebral edema: Secondary | ICD-10-CM | POA: Diagnosis present

## 2016-01-25 DIAGNOSIS — Z7982 Long term (current) use of aspirin: Secondary | ICD-10-CM | POA: Diagnosis not present

## 2016-01-25 DIAGNOSIS — I129 Hypertensive chronic kidney disease with stage 1 through stage 4 chronic kidney disease, or unspecified chronic kidney disease: Secondary | ICD-10-CM | POA: Diagnosis present

## 2016-01-25 DIAGNOSIS — Z515 Encounter for palliative care: Secondary | ICD-10-CM | POA: Diagnosis present

## 2016-01-25 DIAGNOSIS — Z85038 Personal history of other malignant neoplasm of large intestine: Secondary | ICD-10-CM

## 2016-01-25 DIAGNOSIS — Z853 Personal history of malignant neoplasm of breast: Secondary | ICD-10-CM | POA: Diagnosis not present

## 2016-01-25 DIAGNOSIS — I619 Nontraumatic intracerebral hemorrhage, unspecified: Secondary | ICD-10-CM

## 2016-01-25 DIAGNOSIS — Z881 Allergy status to other antibiotic agents status: Secondary | ICD-10-CM

## 2016-01-25 DIAGNOSIS — G250 Essential tremor: Secondary | ICD-10-CM | POA: Diagnosis present

## 2016-01-25 DIAGNOSIS — R51 Headache: Secondary | ICD-10-CM | POA: Diagnosis present

## 2016-01-25 DIAGNOSIS — M810 Age-related osteoporosis without current pathological fracture: Secondary | ICD-10-CM | POA: Diagnosis present

## 2016-01-25 DIAGNOSIS — E785 Hyperlipidemia, unspecified: Secondary | ICD-10-CM | POA: Diagnosis present

## 2016-01-25 DIAGNOSIS — Z66 Do not resuscitate: Secondary | ICD-10-CM | POA: Diagnosis present

## 2016-01-25 DIAGNOSIS — I629 Nontraumatic intracranial hemorrhage, unspecified: Principal | ICD-10-CM

## 2016-01-25 LAB — COMPREHENSIVE METABOLIC PANEL
ALK PHOS: 61 U/L (ref 38–126)
ALT: 14 U/L (ref 14–54)
ANION GAP: 7 (ref 5–15)
AST: 24 U/L (ref 15–41)
Albumin: 3.8 g/dL (ref 3.5–5.0)
BILIRUBIN TOTAL: 0.7 mg/dL (ref 0.3–1.2)
BUN: 26 mg/dL — ABNORMAL HIGH (ref 6–20)
CALCIUM: 8.9 mg/dL (ref 8.9–10.3)
CO2: 27 mmol/L (ref 22–32)
CREATININE: 1.44 mg/dL — AB (ref 0.44–1.00)
Chloride: 107 mmol/L (ref 101–111)
GFR calc non Af Amer: 30 mL/min — ABNORMAL LOW (ref >60)
GFR, EST AFRICAN AMERICAN: 35 mL/min — AB (ref >60)
Glucose, Bld: 136 mg/dL — ABNORMAL HIGH (ref 65–99)
Potassium: 3.9 mmol/L (ref 3.5–5.1)
Sodium: 141 mmol/L (ref 135–145)
TOTAL PROTEIN: 6.2 g/dL — AB (ref 6.5–8.1)

## 2016-01-25 LAB — CBC
HEMATOCRIT: 38.3 % (ref 35.0–47.0)
HEMOGLOBIN: 12.7 g/dL (ref 12.0–16.0)
MCH: 31.6 pg (ref 26.0–34.0)
MCHC: 33.2 g/dL (ref 32.0–36.0)
MCV: 95.3 fL (ref 80.0–100.0)
Platelets: 255 10*3/uL (ref 150–440)
RBC: 4.01 MIL/uL (ref 3.80–5.20)
RDW: 13.8 % (ref 11.5–14.5)
WBC: 7.7 10*3/uL (ref 3.6–11.0)

## 2016-01-25 LAB — PROTIME-INR
INR: 0.95
Prothrombin Time: 12.7 seconds (ref 11.4–15.2)

## 2016-01-25 MED ORDER — MORPHINE SULFATE (PF) 10 MG/ML IV SOLN
INTRAVENOUS | Status: AC
  Filled 2016-01-25: qty 1

## 2016-01-25 MED ORDER — ONDANSETRON HCL 4 MG/2ML IJ SOLN
INTRAMUSCULAR | Status: AC
  Administered 2016-01-25: 23:00:00
  Filled 2016-01-25: qty 2

## 2016-01-25 MED ORDER — MORPHINE SULFATE (PF) 2 MG/ML IV SOLN
4.0000 mg | Freq: Once | INTRAVENOUS | Status: AC
  Administered 2016-01-25: 4 mg via INTRAVENOUS

## 2016-01-25 MED ORDER — MORPHINE SULFATE (PF) 2 MG/ML IV SOLN
INTRAVENOUS | Status: AC
  Administered 2016-01-25: 2 mg via INTRAVENOUS
  Filled 2016-01-25: qty 1

## 2016-01-25 MED ORDER — METOCLOPRAMIDE HCL 5 MG/ML IJ SOLN
INTRAMUSCULAR | Status: AC
  Administered 2016-01-25: 10 mg
  Filled 2016-01-25: qty 2

## 2016-01-25 MED ORDER — ONDANSETRON HCL 4 MG/2ML IJ SOLN
4.0000 mg | Freq: Once | INTRAMUSCULAR | Status: AC
  Administered 2016-01-25: 4 mg via INTRAVENOUS
  Filled 2016-01-25: qty 2

## 2016-01-25 NOTE — ED Notes (Signed)
Pt is c/o of headache on forehead. Rolling around on stretcher saying "ow it hurts." Pt daughter at bedside.

## 2016-01-25 NOTE — ED Notes (Signed)
Dr Kerman Passey at bedside to speak with pt and daughter

## 2016-01-25 NOTE — ED Notes (Signed)
Jann EDT at bedside. Stated pt threw up again. Verbal orders for 10mg  Reglan ordered. Given to pt.

## 2016-01-25 NOTE — ED Notes (Signed)
Pt transported to CT via stretcher.  

## 2016-01-25 NOTE — ED Provider Notes (Signed)
[facility] Emergency Department Provider Note  Time seen: 10:15 PM  I have reviewed the triage vital signs and the nursing notes.   HISTORY  Chief Complaint Dizziness and Headache    HPI [name] is a 82 y.o. female with a past medical history of colon cancer, CK D, hyperlipidemia, hypertension, presents the emergency department with a headache. According to the daughter approximately one hour prior to presentation the patient began describing a very severe right-sided headache. Daughter states the headache had progressed and the patient had become less aware, less able to answer questions. Patient is unable to contribute to the history this time. She is awake and alert moaning holding her head. Asking for her daughter who is standing next to her, but cannot answer questions or follow commands at this time.  Past Medical History:  Diagnosis Date  . Arthritis   . Benign essential tremor   . Calculus of kidney   . Cancer [facility])    colon cancer  . Cancer [facility])    breast cancer (right)  . Cellulitis    right breast  . Cervical spinal stenosis   . CKD (chronic kidney disease), stage III   . Diverticulitis   . HLD (hyperlipidemia)   . Hypertension   . Multinodular goiter   . Osteoporosis   . Toe fracture     Patient Active Problem List   Diagnosis Date Noted  . Bacteremia due to group B Streptococcus 11/13/2015  . Toe fracture 03/31/2015  . H/O transient cerebral ischemia 07/25/2014  . 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 Benign essential HTN 11/13/2013  . Cervical spinal stenosis 08/23/2013  . Chronic kidney disease (CKD), stage III (moderate) 08/23/2013  . Malignant neoplasm of colon ([place]) 08/23/2013  . Diverticular hemorrhage 08/23/2013  . HLD (hyperlipidemia) 08/23/2013  . Multinodular goiter 08/23/2013  . Calculus of kidney 08/23/2013  . OP (osteoporosis) 08/23/2013  . Benign essential tremor 08/23/2013    Past Surgical History:  Procedure  Laterality Date  . APPENDECTOMY    . BREAST LUMPECTOMY Right    lumpectomy, no nodes  . CATARACT EXTRACTION, BILATERAL Bilateral   . CHOLECYSTECTOMY    . COLECTOMY  1982   colon cancer and diverticulosis  . HIP SURGERY Left    fracture repair  . KYPHOPLASTY N/A 12/16/2015   Procedure: KYPHOPLASTY;  Surgeon: Hessie Knows, MD;  Location: [facility] ORS;  Service: Orthopedics;  Laterality: N/A;  . TONSILLECTOMY      Prior to Admission medications   Medication Sig Start Date End Date Taking? Authorizing Provider  acetaminophen (TYLENOL) 500 MG tablet Take 1,000 mg by mouth 2 (two) times daily.     Historical Provider, MD  amLODipine (NORVASC) 5 MG tablet Take 5 mg by mouth daily.     Historical Provider, MD  Apoaequorin (PREVAGEN PO) Take 1 tablet by mouth daily.    Historical Provider, MD  aspirin EC 81 MG tablet Take 81 mg by mouth daily.     Historical Provider, MD  B Complex Vitamins (VITAMIN-B COMPLEX) TABS Take 1 tablet by mouth daily.     Historical Provider, MD  Biotin w/ Vitamins C & E (HAIR/SKIN/NAILS PO) Take 1 tablet by mouth daily.    Historical Provider, MD  Calcium-Vitamin D-Vitamin K (VIACTIV PO) Take 1 tablet by mouth daily.    Historical Provider, MD  Cholecalciferol (VITAMIN D3) 1000 units CAPS Take 1 capsule by mouth daily.    Historical Provider, MD  denosumab (PROLIA) 60 MG/ML SOLN  injection Inject 60 mg into the skin every 6 (six) months.     Historical Provider, MD  gabapentin (NEURONTIN) 300 MG capsule Take 300 mg by mouth 2 (two) times daily.     Historical Provider, MD  Multiple Vitamin (MULTI-VITAMINS) TABS Take 1 tablet by mouth daily.     Historical Provider, MD  propranolol (INDERAL) 20 MG tablet Take 20 mg by mouth daily.  07/24/15   Historical Provider, MD    Allergies  Allergen Reactions  . Donepezil Nausea Only  . Aricept [Donepezil Hcl] Other (See Comments)    GI upset  . Vancomycin Hives    History reviewed. No pertinent family history.  Social  History Social History  Substance Use Topics  . Smoking status: Never Smoker  . Smokeless tobacco: Never Used  . Alcohol use No    Review of Systems Unable to obtain review of systems due to altered mental status  ____________________________________________   PHYSICAL EXAM:  VITAL SIGNS: ED Triage Vitals [01/23/2016 2142]  Enc Vitals Group     BP (!) 157/104     Pulse Rate 84     Resp (!) 22     Temp 98.1 F (36.7 C)     Temp Source Oral     SpO2 98 %     Weight 135 lb (61.2 kg)     Height 5\' 2"  (1.575 m)     Head Circumference      Peak Flow      Pain Score      Pain Loc      Pain Edu?      Excl. in [place]?     Constitutional: Alert, holding her head moaning, asking for her daughter. Eyes: Normal exam ENT   Head: Normocephalic and atraumatic.   Mouth/Throat: Mucous membranes are moist. Cardiovascular: Normal rate, regular rhythm. No murmur Respiratory: Normal respiratory effort without tachypnea nor retractions. Breath sounds are clear  Gastrointestinal: Soft and nontender. No distention.  Neurologic:  Moaning, does not answer questions. Does not follow commands. Skin:  Skin is warm, dry and intact.    ____________________________________________    EKG  EKG reviewed and interpreted by myself shows normal sinus rhythm at 92 bpm, narrow QRS, left axis deviation, normal intervals with nonspecific but no concerning ST changes.  ____________________________________________    RADIOLOGY  CT scan of the head on my read appears to show a large right-sided bleed. Currently awaiting radiology read.  ____________________________________________   INITIAL IMPRESSION / ASSESSMENT AND PLAN / ED COURSE  Pertinent labs & imaging results that were available during my care of the patient were reviewed by me and considered in my medical decision making (see chart for details).  Patient appears to have a large intracerebral hemorrhage. Patient is moaning holding  her head, alert but is not responding to commands/questions. Just asks for her daughter who is standing next to her. I have discussed the CT results with the daughter. She states over the past 1 week the patient has become somewhat more confused and describing headaches at times having word finding difficulty at times. I discussed the options as far as transfer to neurosurgical capable facility versus comfort care. I did discuss however given her age and comorbidities a very grim prognosis and this is likely a fatal bleed. Daughter understands, and is asking for comfort care measures at this time.  Radiologist confirms large intracranial hemorrhage with midline shift. Patient is now vomiting. She appears much more comfortable after morphine. We will  dose additional doses of nausea medication, pain medication as needed for any discomfort. Unfortunately given the significant bleed with the patient's age I suspect this will be a rapidly progressing condition leading to death. Family wishes for comfort measures for the patient which I believe is appropriate. ____________________________________________   FINAL CLINICAL IMPRESSION(S) / ED DIAGNOSES  Intracranial hemorrhage Comfort care   Harvest Dark, MD 01/07/2016 2248

## 2016-01-25 NOTE — ED Notes (Addendum)
Pt family member came to door stating pt was getting sick again. Sam and [place] EDT to bedside. This RN received verbal orders from Dr. Kerman Passey for 4mg  zofran and 2mg  morphine. This Rn to bedside.

## 2016-01-25 NOTE — ED Notes (Signed)
Pt threw up multiple times. Pt was cleaned, new gown placed on her, bed was cleaned and linen changed. Pt is lying on stretcher, R arm reaching out in front of her. Moaning.

## 2016-01-25 NOTE — ED Notes (Signed)
Per daughter, pt started having a headache tonight after supper and kept telling daughter it was getting worse and not better. Daughter states about a week ago she ran out of [place], and was without it a few days. States she has seemed more confused recently, can't find words easily, "She couldn't think of the name for the headlight on my car" per daughter.   Denies hx of migraines, TIA, or stroke.

## 2016-01-26 MED ORDER — ONDANSETRON HCL 4 MG/2ML IJ SOLN: 4.0000 mg | Freq: Four times a day (QID) | INTRAMUSCULAR | Status: DC | PRN

## 2016-01-26 MED ORDER — LORAZEPAM 1 MG PO TABS: 1.0000 mg | ORAL_TABLET | ORAL | Status: DC | PRN

## 2016-01-26 MED ORDER — SCOPOLAMINE 1 MG/3DAYS TD PT72
1.0000 | MEDICATED_PATCH | TRANSDERMAL | Status: DC | PRN
  Administered 2016-01-26: 02:00:00 1.5 mg via TRANSDERMAL
  Filled 2016-01-26 (×2): qty 1

## 2016-01-26 MED ORDER — HALOPERIDOL LACTATE 5 MG/ML IJ SOLN: 0.5000 mg | INTRAMUSCULAR | Status: DC | PRN

## 2016-01-26 MED ORDER — LORAZEPAM 2 MG/ML IJ SOLN: 1.0000 mg | INTRAMUSCULAR | Status: DC | PRN

## 2016-01-26 MED ORDER — HALOPERIDOL 0.5 MG PO TABS: 0.5000 mg | ORAL_TABLET | ORAL | Status: DC | PRN

## 2016-01-26 MED ORDER — HALOPERIDOL LACTATE 2 MG/ML PO CONC: 0.5000 mg | ORAL | Status: DC | PRN

## 2016-01-26 MED ORDER — LORAZEPAM 2 MG/ML PO CONC: 1.0000 mg | ORAL | Status: DC | PRN

## 2016-01-26 MED ORDER — ONDANSETRON HCL 4 MG PO TABS: 4.0000 mg | ORAL_TABLET | Freq: Four times a day (QID) | ORAL | Status: DC | PRN

## 2016-01-26 MED ORDER — MORPHINE SULFATE (CONCENTRATE) 10 MG/0.5ML PO SOLN: 5.0000 mg | ORAL | Status: DC | PRN

## 2016-01-26 MED ORDER — ACETAMINOPHEN 650 MG RE SUPP: 650.0000 mg | Freq: Four times a day (QID) | RECTAL | Status: DC | PRN

## 2016-01-26 MED ORDER — ACETAMINOPHEN 325 MG PO TABS: 650.0000 mg | ORAL_TABLET | Freq: Four times a day (QID) | ORAL | Status: DC | PRN

## 2016-01-26 MED ORDER — MORPHINE SULFATE (PF) 2 MG/ML IV SOLN
1.0000 mg | INTRAVENOUS | Status: DC | PRN
  Administered 2016-01-26: 1 mg via INTRAVENOUS
  Filled 2016-01-26 (×2): qty 1

## 2016-01-28 NOTE — Progress Notes (Signed)
[facility] responded to OR for End of Life in room 106. Pt was non responsive. Daughter and Son-In-Law were bedside. Pt was admitted last evening and was responsive but after testing was no longer responsive. Family is in shock. Family has good support from church and other family members. CH provided the ministry of presence, listening, and prayer. CH is available for follow up as needed.    02-11-16 1000  Clinical Encounter Type  Visited With Patient;Patient and family together  Visit Type Initial;Spiritual support;Patient actively dying  Referral From Nurse  Spiritual Encounters  Spiritual Needs Prayer;Emotional;Grief support  Stress Factors  Family Stress Factors Exhausted;Health changes;Major life changes

## 2016-01-28 NOTE — Progress Notes (Addendum)
Nutrition Brief Note  Chart reviewed. Pt now transitioning to comfort care.  No further nutrition interventions warranted at this time.  Please re-consult as needed.   Youcef Klas M. Kael Keetch, MS, RD LDN Inpatient Clinical Dietitian Pager [phone]    

## 2016-01-28 NOTE — Discharge Summary (Signed)
    Death Note please see Last Note for all details.   In breif -82 year old female patient admitted for headache, dizziness, lethargy. The head showed parenchymal hemorrhage in the right temporal lobe with subdural extension and right hemispheric subdural hematoma, right to left shift. Admitted for comfort care measures as per family wishes. Patient expired on 02-08-2023 at 10:30 PM.    [name] [id number] is a 82 y.o. female, Outpatient Primary MD for the patient is Rusty Aus, MD  Pronounced dead by 02-08-2023 at 10:30 PM.                 Cause of death  Intracranial hemorrhage  Total clinical and documentation time for today Under 30 minutes   Last Note

## 2016-01-28 NOTE — Care Management (Signed)
Admitted to this facility with the diagnosis of intracranial hemorrhage. Daughter, Samule Ohm, lives with her. [phone]). Last seen Dr. Emily Filbert 2 weeks ago. [facility] is in the home now. [facility] last July. No home oxygen. Bedside commode, shower chair, shower bench and rollayor in the home. Fair appetite prior to this admission. Falls in the past. Mail order prescriptions or gets medications at [facility] in [place] since last PM. Comfort Measures. Daughter at the bedside. Shelbie Ammons RN MSN CCM Care Management [phone]

## 2016-01-28 NOTE — Care Management Important Message (Signed)
Important Message  Patient Details  Name: TYRONDA AVITABILE MRN: [id number] Date of Birth: Jan 04, 1921   Medicare Important Message Given:  Yes    Shelbie Ammons, RN 02-21-2016, 8:31 AM

## 2016-01-28 NOTE — H&P (Signed)
[place] @ [facility] Admission History and Physical Harvie Bridge, D.O.  ---------------------------------------------------------------------------------------------------------------------   PATIENT NAME: [name] MR#: [id number] DATE OF BIRTH: [birth year] DATE OF ADMISSION: 01/27/2016 PRIMARY CARE PHYSICIAN: Rusty Aus, MD  REQUESTING/REFERRING PHYSICIAN: ED Dr. Kerman Passey  CHIEF COMPLAINT: Chief Complaint  Patient presents with  . Dizziness  . Headache    HISTORY OF PRESENT ILLNESS: Please note the entire history was obtained from the emergency department physician and the chart as the patient is unable to provide history due to altered mental status.  [name] is a 82 y.o. female with a known history of colon cancer, CKD, hyperlipidemia, hypertension presents to the emergency department complaining of headache.  Patient was in a usual state of health until this evening when the patient described sudden onset of severe right sided headache which is becoming progressively worse. Patient's daughter had reported that she was becoming confused,, having difficulty with word finding at times over the past week. In the emergency department patient is confused, lethargic lethargic and had begun vomiting. She has received antiemetics and morphine for comfort    PAST MEDICAL HISTORY: Past Medical History:  Diagnosis Date  . Arthritis   . Benign essential tremor   . Calculus of kidney   . Cancer [facility])    colon cancer  . Cancer [facility])    breast cancer (right)  . Cellulitis    right breast  . Cervical spinal stenosis   . CKD (chronic kidney disease), stage III   . Diverticulitis   . HLD (hyperlipidemia)   . Hypertension   . Multinodular goiter   . Osteoporosis   . Toe fracture       PAST SURGICAL HISTORY: Past Surgical History:  Procedure Laterality Date  . APPENDECTOMY    . BREAST LUMPECTOMY Right    lumpectomy, no nodes  . CATARACT  EXTRACTION, BILATERAL Bilateral   . CHOLECYSTECTOMY    . COLECTOMY  1982   colon cancer and diverticulosis  . HIP SURGERY Left    fracture repair  . KYPHOPLASTY N/A 12/16/2015   Procedure: KYPHOPLASTY;  Surgeon: Hessie Knows, MD;  Location: [facility] ORS;  Service: Orthopedics;  Laterality: N/A;  . TONSILLECTOMY        SOCIAL HISTORY: Social History  Substance Use Topics  . Smoking status: Never Smoker  . Smokeless tobacco: Never Used  . Alcohol use No      FAMILY HISTORY: History reviewed. No pertinent family history.   MEDICATIONS AT HOME: Prior to Admission medications   Medication Sig Start Date End Date Taking? Authorizing Provider  acetaminophen (TYLENOL) 500 MG tablet Take 1,000 mg by mouth 2 (two) times daily.     Historical Provider, MD  amLODipine (NORVASC) 5 MG tablet Take 5 mg by mouth daily.     Historical Provider, MD  Apoaequorin (PREVAGEN PO) Take 1 tablet by mouth daily.    Historical Provider, MD  aspirin EC 81 MG tablet Take 81 mg by mouth daily.     Historical Provider, MD  B Complex Vitamins (VITAMIN-B COMPLEX) TABS Take 1 tablet by mouth daily.     Historical Provider, MD  Biotin w/ Vitamins C & E (HAIR/SKIN/NAILS PO) Take 1 tablet by mouth daily.    Historical Provider, MD  Calcium-Vitamin D-Vitamin K (VIACTIV PO) Take 1 tablet by mouth daily.    Historical Provider, MD  Cholecalciferol (VITAMIN D3) 1000 units CAPS Take 1 capsule by mouth daily.    Historical Provider, MD  denosumab (  PROLIA) 60 MG/ML SOLN injection Inject 60 mg into the skin every 6 (six) months.     Historical Provider, MD  gabapentin (NEURONTIN) 300 MG capsule Take 300 mg by mouth 2 (two) times daily.     Historical Provider, MD  Multiple Vitamin (MULTI-VITAMINS) TABS Take 1 tablet by mouth daily.     Historical Provider, MD  propranolol (INDERAL) 20 MG tablet Take 20 mg by mouth daily.  07/24/15   Historical Provider, MD      DRUG ALLERGIES: Allergies  Allergen Reactions  . Donepezil  Nausea Only  . Aricept [Donepezil Hcl] Other (See Comments)    GI upset  . Vancomycin Hives     REVIEW OF SYSTEMS: Unable to obtain secondary to altered mental status  PHYSICAL EXAMINATION: VITAL SIGNS: Blood pressure (!) 197/81, pulse 95, temperature 97.6 F (36.4 C), temperature source Oral, resp. rate (!) 21, height 5\' 2"  (1.575 m), weight 61.2 kg (135 lb), SpO2 (!) 86 %.  GENERAL: 82 y.o.-year-old white female patient, well-developed, well-nourished lying in the bed in no acute distress HEENT: Head atraumatic, normocephalic.  EXTREMITIES: No pedal edema, cyanosis, or clubbing. NEUROLOGIC: Unable to assess secondary to altered mental status SKIN: Warm, dry, and intact without obvious rash, lesion, or ulcer.  LABORATORY PANEL:  CBC  Recent Labs Lab 12/30/2015 2239  WBC 7.7  HGB 12.7  HCT 38.3  PLT 255   ----------------------------------------------------------------------------------------------------------------- Chemistries  Recent Labs Lab 01/17/2016 2239  NA 141  K 3.9  CL 107  CO2 27  GLUCOSE 136*  BUN 26*  CREATININE 1.44*  CALCIUM 8.9  AST 24  ALT 14  ALKPHOS 61  BILITOT 0.7   ------------------------------------------------------------------------------------------------------------------ Cardiac Enzymes No results for input(s): TROPONINI in the last 168 hours. ------------------------------------------------------------------------------------------------------------------  RADIOLOGY: Ct Head Wo Contrast  Result Date: 01/10/2016 CLINICAL DATA:  Initial evaluation for acute headache. EXAM: CT HEAD WITHOUT CONTRAST TECHNIQUE: Contiguous axial images were obtained from the base of the skull through the vertex without intravenous contrast. COMPARISON:  Prior studies from 09/14/2015. FINDINGS: Brain: Acute intraparenchymal hematoma centered at the right temporal lobe measures approximately 37 x 36 x 34 mm (estimated volume 22.6 cc). Surrounding  scattered foci of hemorrhage and low-density vasogenic edema. Subdural extension with a right holo hemispheric subdural hematoma measuring up to 8 mm in maximal thickness. Associated 9 mm of right-to-left shift. Right lateral ventricle is partially effaced. No hydrocephalus or evidence for ventricular trapping at this time. Additional parenchymal hemorrhage at the left temporal lobe measures 10 x 12 x 10 mm. Mild surrounding edema without significant mass effect. Additional probable small intraparenchymal hemorrhage within the posterior left temporal lobe (series 5, image 47). Adjacent trace subarachnoid hemorrhage noted (series 2, image 14). No other acute large vessel territory infarct. No other mass lesion. Remote right PCA territory infarct noted, stable. Vascular: Intracranial atherosclerosis. No appreciable hyperdense vessel. Skull: Scalp soft tissues demonstrate no acute abnormality. No calvarial fracture. Sinuses/Orbits: Globes and orbits grossly unremarkable, although evaluation limited by motion. Chronic left maxillary sinus disease noted. Paranasal sinuses otherwise grossly clear. No obvious mastoid effusion. IMPRESSION: 1. 37 x 36 x 34 mm parenchymal hemorrhage centered within the right temporal lobe with associated subdural extension and right holo hemispheric subdural hematoma. Associated mass effect with 9 mm of right-to-left shift. Given the patient's history of additional recent bleeds on studies from 09/14/2015, as well as the absence of trauma history on today's study, primary differential considerations include amyloid angiopathy, hypertensive bleeds, or possibly infarcts with hemorrhagic transformation.  Underlying intra cerebral masses not entirely excluded, although this is felt to be less likely given the absence of apparent lesions on recent MRI. 2. Additional 10 x 12 x 10 mm left temporal lobe intraparenchymal hematoma CED a that is not canal dense but smaller he had a head without  significant mass effect. 3. Trace subarachnoid hemorrhage within the left temporal lobe. Critical Value/emergent results were called by telephone at the time of interpretation on 01/13/2016 at 10:22 pm to Dr. Harvest Dark , who verbally acknowledged these results. Electronically Signed   By: Jeannine Boga M.D.   On: 01/10/2016 22:40    EKG: Normal sinus rhythm at 92 bpm with leftward axis and nonspecific ST-T wave changes.   IMPRESSION AND PLAN:  This is a 82 y.o. female with a history of colon cancer, CK D, hyperlipidemia, hypertension now being admitted with:  Intracranial hemorrhage-I discussed the patient's grim prognosis with her daughter and son-in-law. At this time they would like for her to be under comfort care measures only. We will therefore admit for antiemetics, pain control, sedatives as needed. I have requested a palliative care and pastoral care consultations although the patient's pastor is in the room with the family.  Diet/Nutrition: Nothing by mouth Fluids: Hep-Lock DVT Px: None Code Status: DO NOT RESUSCITATE  All the records are reviewed and case discussed with ED provider. Management plans discussed with the patient and/or family who express understanding and agree with plan of care.   TOTAL TIME TAKING CARE OF THIS PATIENT: 50 minutes.   Jacquise Rarick D.O. on 2016-02-09 at 1:04 AM Between 7am to 6pm - Pager - [phone] After 6pm go to [email or web address] - [employer] [place] [facility] Office [phone] CC: Primary care physician; Rusty Aus, MD     Note: This dictation was prepared with Dragon dictation along with smaller phrase technology. Any transcriptional errors that result from this process are unintentional.

## 2016-01-28 NOTE — Progress Notes (Signed)
Patient admitted for headache, dizziness, confusion, found to have intracranial hemorrhage. Now on comfort care measures with Ativan, morphine, scopolamine patch. CODE STATUS is DO NOT RESUSCITATE.  Discussed with  daughter

## 2016-01-28 NOTE — Progress Notes (Signed)
Pt without respirations or heartbeat 

## 2016-01-28 NOTE — Progress Notes (Signed)
Chaplain was making his rounds and visited with pt in room 106. Provided the ministry of prayer    02-11-2016 1345  Clinical Encounter Type  Visited With Patient;Patient and family together  Visit Type Initial;Spiritual support  Referral From Nurse  [place] text;Prayer

## 2016-01-28 DEATH — deceased

## 2016-02-27 ENCOUNTER — Ambulatory Visit: Payer: Medicare Other | Admitting: Podiatry

## 2018-07-11 IMAGING — CR DG THORACIC SPINE 2V
1 series · 1 of 1 positions shown · non-contrast
Comparison: MRI 12/09/2015

CLINICAL DATA: T12 and L1 kyphoplasty

EXAM:
THORACIC SPINE 2 VIEWS

[cont.]
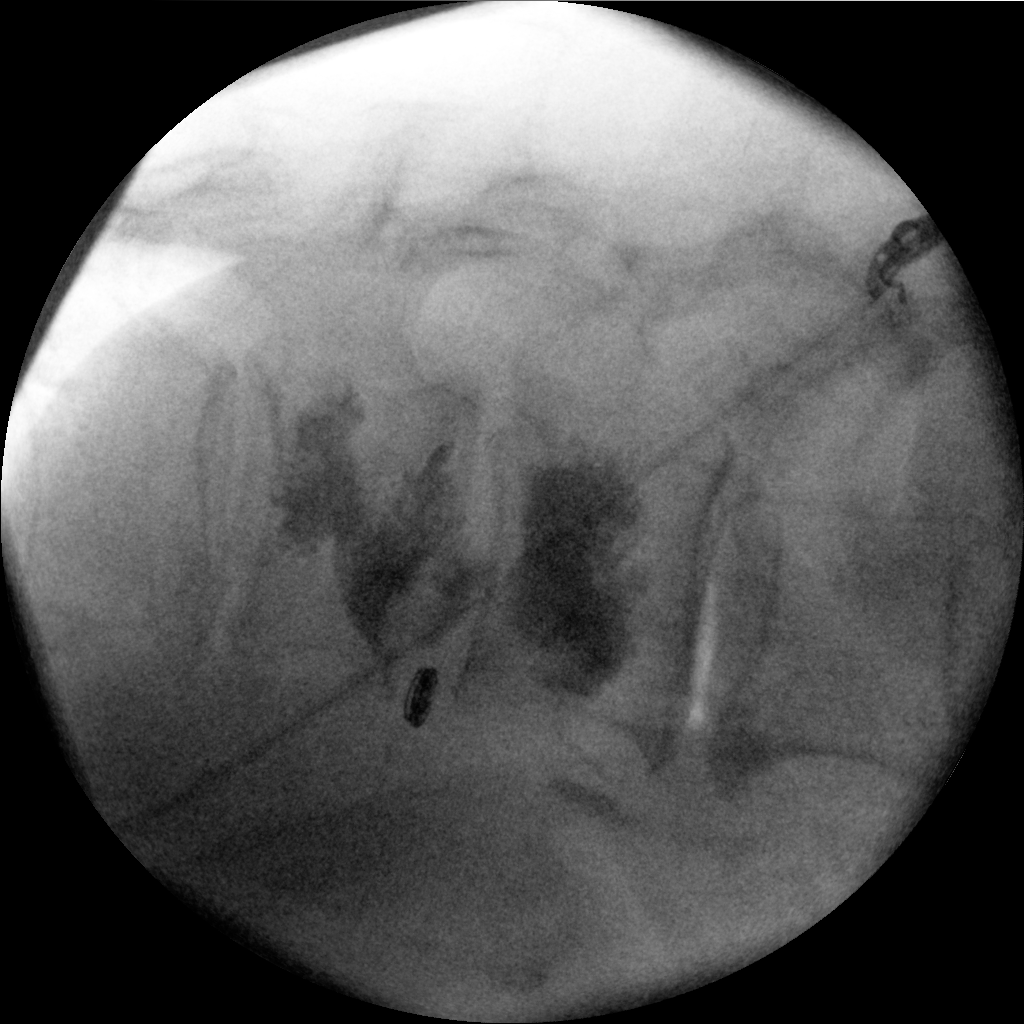

[1 of 1 positions shown; findings below may reference images not displayed]

FINDINGS: Two intraoperative spot images demonstrate changes of 2 level
vertebral augmentation, reportedly T12 and L1. No visible
complicating feature.
IMPRESSION: T12 and L1 vertebral augmentation.
